# Patient Record
Sex: Female | Born: 1970 | Race: Black or African American | Hispanic: No | Marital: Single | Smoking: Never smoker
Health system: Southern US, Community
[De-identification: ages and names within clinical notes are randomized; demographics above are authoritative.]

## PROBLEM LIST (undated history)

## (undated) DIAGNOSIS — I10 Essential (primary) hypertension: Secondary | ICD-10-CM

## (undated) HISTORY — PX: TUBAL LIGATION: SHX77

---

## 2000-12-01 ENCOUNTER — Emergency Department (HOSPITAL_COMMUNITY): Admission: EM | Admit: 2000-12-01 | Discharge: 2000-12-02 | Payer: Self-pay | Admitting: Emergency Medicine

## 2001-03-02 ENCOUNTER — Other Ambulatory Visit: Admission: RE | Admit: 2001-03-02 | Discharge: 2001-03-02 | Payer: Self-pay | Admitting: *Deleted

## 2001-03-02 ENCOUNTER — Encounter: Admission: RE | Admit: 2001-03-02 | Discharge: 2001-03-02 | Payer: Self-pay | Admitting: Family Medicine

## 2001-07-21 ENCOUNTER — Encounter: Admission: RE | Admit: 2001-07-21 | Discharge: 2001-07-21 | Payer: Self-pay | Admitting: Family Medicine

## 2001-12-17 ENCOUNTER — Encounter: Admission: RE | Admit: 2001-12-17 | Discharge: 2001-12-17 | Payer: Self-pay | Admitting: Family Medicine

## 2002-01-17 ENCOUNTER — Encounter: Admission: RE | Admit: 2002-01-17 | Discharge: 2002-01-17 | Payer: Self-pay | Admitting: Family Medicine

## 2002-02-01 ENCOUNTER — Encounter: Admission: RE | Admit: 2002-02-01 | Discharge: 2002-02-01 | Payer: Self-pay | Admitting: Family Medicine

## 2002-03-18 ENCOUNTER — Other Ambulatory Visit: Admission: RE | Admit: 2002-03-18 | Discharge: 2002-03-18 | Payer: Self-pay | Admitting: *Deleted

## 2002-03-18 ENCOUNTER — Encounter: Admission: RE | Admit: 2002-03-18 | Discharge: 2002-03-18 | Payer: Self-pay | Admitting: Family Medicine

## 2002-04-05 ENCOUNTER — Encounter (INDEPENDENT_AMBULATORY_CARE_PROVIDER_SITE_OTHER): Payer: Self-pay | Admitting: *Deleted

## 2002-04-05 LAB — CONVERTED CEMR LAB

## 2002-06-20 ENCOUNTER — Encounter: Admission: RE | Admit: 2002-06-20 | Discharge: 2002-06-20 | Payer: Self-pay | Admitting: Family Medicine

## 2002-07-19 ENCOUNTER — Encounter: Admission: RE | Admit: 2002-07-19 | Discharge: 2002-07-19 | Payer: Self-pay | Admitting: Sports Medicine

## 2002-08-26 ENCOUNTER — Encounter: Admission: RE | Admit: 2002-08-26 | Discharge: 2002-08-26 | Payer: Self-pay | Admitting: Family Medicine

## 2002-10-13 ENCOUNTER — Encounter: Admission: RE | Admit: 2002-10-13 | Discharge: 2002-10-13 | Payer: Self-pay | Admitting: Family Medicine

## 2002-12-22 ENCOUNTER — Encounter: Admission: RE | Admit: 2002-12-22 | Discharge: 2002-12-22 | Payer: Self-pay | Admitting: Family Medicine

## 2003-01-30 ENCOUNTER — Encounter: Admission: RE | Admit: 2003-01-30 | Discharge: 2003-01-30 | Payer: Self-pay | Admitting: Family Medicine

## 2004-01-12 ENCOUNTER — Emergency Department (HOSPITAL_COMMUNITY): Admission: EM | Admit: 2004-01-12 | Discharge: 2004-01-12 | Payer: Self-pay | Admitting: Family Medicine

## 2006-04-17 ENCOUNTER — Ambulatory Visit: Payer: Self-pay | Admitting: Family Medicine

## 2006-05-01 ENCOUNTER — Ambulatory Visit: Payer: Self-pay | Admitting: Family Medicine

## 2006-07-27 ENCOUNTER — Emergency Department (HOSPITAL_COMMUNITY): Admission: EM | Admit: 2006-07-27 | Discharge: 2006-07-27 | Payer: Self-pay | Admitting: Emergency Medicine

## 2006-11-27 ENCOUNTER — Ambulatory Visit: Payer: Self-pay | Admitting: Family Medicine

## 2006-12-31 DIAGNOSIS — I10 Essential (primary) hypertension: Secondary | ICD-10-CM

## 2006-12-31 DIAGNOSIS — K219 Gastro-esophageal reflux disease without esophagitis: Secondary | ICD-10-CM | POA: Insufficient documentation

## 2006-12-31 DIAGNOSIS — L708 Other acne: Secondary | ICD-10-CM | POA: Insufficient documentation

## 2006-12-31 HISTORY — DX: Essential (primary) hypertension: I10

## 2007-01-01 ENCOUNTER — Encounter (INDEPENDENT_AMBULATORY_CARE_PROVIDER_SITE_OTHER): Payer: Self-pay | Admitting: *Deleted

## 2007-02-11 ENCOUNTER — Emergency Department (HOSPITAL_COMMUNITY): Admission: EM | Admit: 2007-02-11 | Discharge: 2007-02-11 | Payer: Self-pay | Admitting: Emergency Medicine

## 2007-03-17 ENCOUNTER — Telehealth: Payer: Self-pay | Admitting: *Deleted

## 2007-03-18 ENCOUNTER — Encounter: Payer: Self-pay | Admitting: *Deleted

## 2007-03-23 ENCOUNTER — Telehealth: Payer: Self-pay | Admitting: *Deleted

## 2007-07-16 ENCOUNTER — Ambulatory Visit: Payer: Self-pay | Admitting: Family Medicine

## 2007-07-16 ENCOUNTER — Encounter (INDEPENDENT_AMBULATORY_CARE_PROVIDER_SITE_OTHER): Payer: Self-pay | Admitting: Family Medicine

## 2007-07-16 LAB — CONVERTED CEMR LAB
BUN: 12 mg/dL (ref 6–23)
CO2: 23 meq/L (ref 19–32)
Creatinine, Ser: 0.9 mg/dL (ref 0.40–1.20)
Glucose, Bld: 87 mg/dL (ref 70–99)
Whiff Test: POSITIVE

## 2007-07-19 ENCOUNTER — Encounter (INDEPENDENT_AMBULATORY_CARE_PROVIDER_SITE_OTHER): Payer: Self-pay | Admitting: Family Medicine

## 2007-07-22 ENCOUNTER — Encounter (INDEPENDENT_AMBULATORY_CARE_PROVIDER_SITE_OTHER): Payer: Self-pay | Admitting: Family Medicine

## 2007-08-11 ENCOUNTER — Ambulatory Visit: Payer: Self-pay | Admitting: Family Medicine

## 2007-08-12 ENCOUNTER — Encounter (INDEPENDENT_AMBULATORY_CARE_PROVIDER_SITE_OTHER): Payer: Self-pay | Admitting: Family Medicine

## 2007-08-12 ENCOUNTER — Ambulatory Visit: Payer: Self-pay | Admitting: Family Medicine

## 2007-08-12 LAB — CONVERTED CEMR LAB
BUN: 16 mg/dL (ref 6–23)
CO2: 25 meq/L (ref 19–32)
Chloride: 104 meq/L (ref 96–112)
Glucose, Bld: 89 mg/dL (ref 70–99)
Potassium: 3.6 meq/L (ref 3.5–5.3)

## 2007-08-13 ENCOUNTER — Encounter (INDEPENDENT_AMBULATORY_CARE_PROVIDER_SITE_OTHER): Payer: Self-pay | Admitting: Family Medicine

## 2007-08-23 ENCOUNTER — Telehealth (INDEPENDENT_AMBULATORY_CARE_PROVIDER_SITE_OTHER): Payer: Self-pay | Admitting: *Deleted

## 2007-09-03 ENCOUNTER — Ambulatory Visit: Payer: Self-pay | Admitting: Family Medicine

## 2007-09-03 ENCOUNTER — Encounter (INDEPENDENT_AMBULATORY_CARE_PROVIDER_SITE_OTHER): Payer: Self-pay | Admitting: Family Medicine

## 2007-09-13 ENCOUNTER — Telehealth: Payer: Self-pay | Admitting: *Deleted

## 2007-11-24 ENCOUNTER — Ambulatory Visit: Payer: Self-pay | Admitting: Family Medicine

## 2007-11-24 LAB — CONVERTED CEMR LAB

## 2007-12-01 ENCOUNTER — Ambulatory Visit: Payer: Self-pay | Admitting: Family Medicine

## 2008-02-16 ENCOUNTER — Ambulatory Visit: Payer: Self-pay | Admitting: Family Medicine

## 2008-02-16 ENCOUNTER — Encounter (INDEPENDENT_AMBULATORY_CARE_PROVIDER_SITE_OTHER): Payer: Self-pay | Admitting: Family Medicine

## 2008-02-16 DIAGNOSIS — E669 Obesity, unspecified: Secondary | ICD-10-CM | POA: Insufficient documentation

## 2008-02-16 LAB — CONVERTED CEMR LAB
Chlamydia, DNA Probe: NEGATIVE
GC Probe Amp, Genital: NEGATIVE
Whiff Test: POSITIVE

## 2008-02-17 LAB — CONVERTED CEMR LAB
ALT: 18 units/L (ref 0–35)
CO2: 23 meq/L (ref 19–32)
Cholesterol: 146 mg/dL (ref 0–200)
LDL Cholesterol: 55 mg/dL (ref 0–99)
Sodium: 140 meq/L (ref 135–145)
Total Bilirubin: 0.4 mg/dL (ref 0.3–1.2)
Total Protein: 8.1 g/dL (ref 6.0–8.3)
VLDL: 18 mg/dL (ref 0–40)

## 2008-02-24 ENCOUNTER — Telehealth: Payer: Self-pay | Admitting: *Deleted

## 2008-02-25 ENCOUNTER — Ambulatory Visit: Payer: Self-pay | Admitting: Family Medicine

## 2008-02-25 LAB — CONVERTED CEMR LAB
Bilirubin Urine: NEGATIVE
Glucose, Urine, Semiquant: NEGATIVE
Protein, U semiquant: 30
Specific Gravity, Urine: 1.015
pH: 8.5

## 2008-03-08 ENCOUNTER — Encounter (INDEPENDENT_AMBULATORY_CARE_PROVIDER_SITE_OTHER): Payer: Self-pay | Admitting: Family Medicine

## 2008-03-08 ENCOUNTER — Ambulatory Visit: Payer: Self-pay | Admitting: Family Medicine

## 2008-03-08 LAB — CONVERTED CEMR LAB: Whiff Test: POSITIVE

## 2008-03-10 ENCOUNTER — Telehealth (INDEPENDENT_AMBULATORY_CARE_PROVIDER_SITE_OTHER): Payer: Self-pay | Admitting: *Deleted

## 2008-03-13 ENCOUNTER — Telehealth (INDEPENDENT_AMBULATORY_CARE_PROVIDER_SITE_OTHER): Payer: Self-pay | Admitting: Family Medicine

## 2008-03-13 LAB — CONVERTED CEMR LAB: Pap Smear: NORMAL

## 2008-03-20 ENCOUNTER — Telehealth (INDEPENDENT_AMBULATORY_CARE_PROVIDER_SITE_OTHER): Payer: Self-pay | Admitting: *Deleted

## 2008-03-23 ENCOUNTER — Telehealth (INDEPENDENT_AMBULATORY_CARE_PROVIDER_SITE_OTHER): Payer: Self-pay | Admitting: Family Medicine

## 2008-04-10 ENCOUNTER — Encounter (INDEPENDENT_AMBULATORY_CARE_PROVIDER_SITE_OTHER): Payer: Self-pay | Admitting: Family Medicine

## 2008-04-10 ENCOUNTER — Ambulatory Visit: Payer: Self-pay | Admitting: Family Medicine

## 2008-04-10 ENCOUNTER — Telehealth: Payer: Self-pay | Admitting: *Deleted

## 2008-04-12 LAB — CONVERTED CEMR LAB
ALT: 21 units/L (ref 0–35)
AST: 18 units/L (ref 0–37)
CO2: 23 meq/L (ref 19–32)
Calcium: 9.7 mg/dL (ref 8.4–10.5)
Chloride: 103 meq/L (ref 96–112)
Potassium: 3.9 meq/L (ref 3.5–5.3)
Sodium: 139 meq/L (ref 135–145)
Total CK: 278 units/L — ABNORMAL HIGH (ref 7–177)
Total Protein: 8 g/dL (ref 6.0–8.3)

## 2008-04-27 ENCOUNTER — Ambulatory Visit: Payer: Self-pay | Admitting: Family Medicine

## 2008-06-02 ENCOUNTER — Ambulatory Visit: Payer: Self-pay | Admitting: Family Medicine

## 2008-10-31 ENCOUNTER — Telehealth: Payer: Self-pay | Admitting: *Deleted

## 2008-10-31 ENCOUNTER — Ambulatory Visit: Payer: Self-pay | Admitting: Family Medicine

## 2009-03-12 ENCOUNTER — Telehealth: Payer: Self-pay | Admitting: Family Medicine

## 2009-05-02 ENCOUNTER — Telehealth: Payer: Self-pay | Admitting: Family Medicine

## 2009-06-26 ENCOUNTER — Ambulatory Visit: Payer: Self-pay | Admitting: Family Medicine

## 2009-07-13 ENCOUNTER — Encounter: Payer: Self-pay | Admitting: Family Medicine

## 2009-07-13 ENCOUNTER — Encounter: Admission: RE | Admit: 2009-07-13 | Discharge: 2009-07-13 | Payer: Self-pay | Admitting: *Deleted

## 2009-07-13 ENCOUNTER — Ambulatory Visit: Payer: Self-pay | Admitting: Family Medicine

## 2009-07-13 LAB — CONVERTED CEMR LAB
BUN: 15 mg/dL (ref 6–23)
CO2: 24 meq/L (ref 19–32)
Calcium: 9.2 mg/dL (ref 8.4–10.5)
Glucose, Bld: 83 mg/dL (ref 70–99)

## 2009-07-25 ENCOUNTER — Encounter: Payer: Self-pay | Admitting: Family Medicine

## 2009-08-03 ENCOUNTER — Telehealth: Payer: Self-pay | Admitting: Family Medicine

## 2009-11-19 ENCOUNTER — Telehealth (INDEPENDENT_AMBULATORY_CARE_PROVIDER_SITE_OTHER): Payer: Self-pay | Admitting: *Deleted

## 2009-11-19 DIAGNOSIS — R87610 Atypical squamous cells of undetermined significance on cytologic smear of cervix (ASC-US): Secondary | ICD-10-CM

## 2009-12-04 ENCOUNTER — Encounter: Payer: Self-pay | Admitting: Family Medicine

## 2009-12-04 LAB — CONVERTED CEMR LAB: Pap Smear: NORMAL

## 2010-03-19 ENCOUNTER — Ambulatory Visit: Payer: Self-pay | Admitting: Family Medicine

## 2010-03-19 DIAGNOSIS — R609 Edema, unspecified: Secondary | ICD-10-CM | POA: Insufficient documentation

## 2010-03-26 ENCOUNTER — Ambulatory Visit: Payer: Self-pay | Admitting: Family Medicine

## 2010-08-08 ENCOUNTER — Ambulatory Visit (HOSPITAL_COMMUNITY): Admission: RE | Admit: 2010-08-08 | Discharge: 2010-08-08 | Payer: Self-pay | Admitting: Family Medicine

## 2010-08-08 ENCOUNTER — Encounter: Payer: Self-pay | Admitting: Family Medicine

## 2010-08-08 ENCOUNTER — Ambulatory Visit: Payer: Self-pay | Admitting: Family Medicine

## 2010-08-08 DIAGNOSIS — R0789 Other chest pain: Secondary | ICD-10-CM

## 2010-08-08 DIAGNOSIS — N898 Other specified noninflammatory disorders of vagina: Secondary | ICD-10-CM | POA: Insufficient documentation

## 2010-08-08 HISTORY — DX: Other chest pain: R07.89

## 2010-08-09 ENCOUNTER — Encounter: Payer: Self-pay | Admitting: Family Medicine

## 2010-08-19 ENCOUNTER — Ambulatory Visit: Payer: Self-pay | Admitting: Family Medicine

## 2010-08-19 ENCOUNTER — Encounter: Payer: Self-pay | Admitting: Family Medicine

## 2010-08-19 LAB — CONVERTED CEMR LAB
BUN: 19 mg/dL (ref 6–23)
CO2: 26 meq/L (ref 19–32)
Calcium: 9.3 mg/dL (ref 8.4–10.5)
Creatinine, Ser: 0.9 mg/dL (ref 0.40–1.20)
HDL: 56 mg/dL (ref >39)
LDL Cholesterol: 66 mg/dL (ref 0–99)
Triglycerides: 88 mg/dL (ref <150)

## 2010-08-20 ENCOUNTER — Encounter: Payer: Self-pay | Admitting: Family Medicine

## 2010-11-23 ENCOUNTER — Encounter: Payer: Self-pay | Admitting: Family Medicine

## 2010-12-03 NOTE — Miscellaneous (Signed)
Summary: Pap update  Clinical Lists Changes  Observations: Added new observation of PAP DUE: 12/04/2010 (08/09/2010 9:56) Added new observation of [id number]: 06/09/2008 (08/09/2010 9:56) Added new observation of LAST PAP DAT: 12/04/2009 (12/04/2009 9:58) Added new observation of PAP SMEAR: normal (12/04/2009 9:58)     Last PAP:  **ATYPICAL SQUAMOUS CELLS OF UNDETERMINED SIGNIFICANCE (07/13/2009 12:00:00 AM) PAP Result Date:  12/04/2009 PAP Result:  normal PAP Next Due:  1 yr

## 2010-12-03 NOTE — Letter (Signed)
Summary: Generic Letter  [facility]     [place], [place]    Phone:   Fax:     08/20/2010  [facility] [address] [place], [place]  [address]  Dear Ms. Jester,   I am writing to inform you that all of your blood work came back normal.  Your cholesterol panel is excellent.  Your cholesterol is 140, HDL (good cholesterol) is great at 56, and your LDL (bad cholesterol) is perfect at 66.  I hope that you are starting to see results from your efforts to lose weight.  I look forward to seeing you again in clinic in a few months.  Take care!      Sincerely,   Demetria Pore MD  Appended Document: Generic Letter mailed

## 2010-12-03 NOTE — Assessment & Plan Note (Signed)
Summary: med refills/eo   Vital Signs:  Patient profile:   40 year old female Height:      60.75 inches Weight:      187 pounds BMI:     35.75 Temp:     98.7 degrees F oral Pulse rate:   88 / minute BP sitting:   144 / 85  (left arm) Cuff size:   regular  Vitals Entered By: Tessie Fass CMA (Mar 26, 2010 8:34 AM)  Serial Vital Signs/Assessments:  Time      Position  BP       Pulse  Resp  Temp     By                     138/84                         Ardeen Garland  MD  CC: med refill Is Patient Diabetic? No Pain Assessment Patient in pain? no        Primary Care Provider:  Ardeen Garland  MD  CC:  med refill.  History of Present Illness: Pamela Schneider comes in today for refills of her medications and c/o knee pain 1) Meds - HTN meds are lisinopril 40, norvasc 10, and HCTZ 25.  She was seen last week by another provider for bilateral LE edema and elevated BP.  Had stopped lisinopril but was taking her other meds.  Can't give any reason why she wasn't taking it, "just wasn't".  WAs advised to restart.  Has and is feeling fine.  Tolerates them well.  BP at goal, but barely today.  LE edema has improved.  No CP or SOB. GERD med - takes omeprazole.  Works well.  No complaints. 2) knee pain - bilateral.  "STiff in the morning" for 30 min to an hour.  Better by the time she gets to work.  Not even bad enough to take a tylenol or ibuprofen.  No swelling, warmth, redness, catching, locking, or giving out.  No trauma.  Going up stairs makes it worse.  WAlking makes it better.   Habits & Providers  Alcohol-Tobacco-Diet     Tobacco Status: never  Current Medications (verified): 1)  Hydrochlorothiazide 25 Mg  Tabs (Hydrochlorothiazide) .... One Tablet By Mouth Daily 2)  Lisinopril 40 Mg  Tabs (Lisinopril) .... One Tablet By Mouth Once Daily 3)  Omeprazole 20 Mg Cpdr (Omeprazole) .Marland Kitchen.. 1 Tab By Mouth Daily 4)  Amlodipine Besylate 10 Mg  Tabs (Amlodipine Besylate) .... Once  Daily  Allergies: No Known Drug Allergies  Physical Exam  General:  VS Reviewed and rechecked. Well appearing, NAD.  Lungs:  Normal respiratory effort, chest expands symmetrically. Lungs are clear to auscultation, no crackles or wheezes. Heart:  Normal rate and regular rhythm. S1 and S2 normal without gallop, murmur, click, rub or other extra sounds. No JVD 2+ dp pulses Pulses:  2+ radial pulses Extremities:  no edema   Impression & Recommendations:  Problem # 1:  PERIPHERAL EDEMA (ICD-782.3) Assessment Improved  Her updated medication list for this problem includes:    Hydrochlorothiazide 25 Mg Tabs (Hydrochlorothiazide) ..... One tablet by mouth daily  Orders: [facility]- Est  Level 4 (19147)  Problem # 2:  HYPERTENSION, BENIGN SYSTEMIC (ICD-401.1) Assessment: Improved  Advised that she is on max dose of all 3 meds and barely at goal.  Advised to watch sodium, increase activity.  Her updated medication list for this problem  includes:    Hydrochlorothiazide 25 Mg Tabs (Hydrochlorothiazide) ..... One tablet by mouth daily    Lisinopril 40 Mg Tabs (Lisinopril) ..... One tablet by mouth once daily    Amlodipine Besylate 10 Mg Tabs (Amlodipine besylate) ..... Once daily  Orders: [facility]- Est  Level 4 ([address]214)  Problem # 3:  GASTROESOPHAGEAL REFLUX, NO ESOPHAGITIS (ICD-530.81) Assessment: Improved  Her updated medication list for this problem includes:    Omeprazole 20 Mg Cpdr (Omeprazole) .Marland Kitchen... 1 tab by mouth daily  Orders: [facility]- Est  Level 4 ([address]644)  Problem # 4:  KNEE PAIN, BILATERAL (ICD-719.46) Assessment: New  Mild.  No treatment at this time.  Given handout on knee/quad strengthening exercises.  Encouraged to increase physical activity.  Ice or heat as needed.  Return if pain worsens.   Orders: [facility]- Est  Level 4 (03474)  Complete Medication List: 1)  Hydrochlorothiazide 25 Mg Tabs (Hydrochlorothiazide) .... One tablet by mouth daily 2)  Lisinopril 40 Mg Tabs  (Lisinopril) .... One tablet by mouth once daily 3)  Omeprazole 20 Mg Cpdr (Omeprazole) .Marland Kitchen.. 1 tab by mouth daily 4)  Amlodipine Besylate 10 Mg Tabs (Amlodipine besylate) .... Once daily  Patient Instructions: 1)  It was great to see you again today. 2)  Be sure to take all 3 of your blood pressure pills everyday as directed - you need them all! 3)  Walking 20-30 minutes 4-5 times a week will help with weight loss, your BP, and your knee pain. Prescriptions: AMLODIPINE BESYLATE 10 MG  TABS (AMLODIPINE BESYLATE) once daily  #30 x 5   Entered and Authorized by:   Ardeen Garland  MD   Signed by:   Ardeen Garland  MD on 03/26/2010   Method used:   Print then Give to Patient   RxID:   [id number] OMEPRAZOLE 20 MG CPDR (OMEPRAZOLE) 1 tab by mouth daily  #30 x 5   Entered and Authorized by:   Ardeen Garland  MD   Signed by:   Ardeen Garland  MD on 03/26/2010   Method used:   Print then Give to Patient   RxID:   [id number] HYDROCHLOROTHIAZIDE 25 MG  TABS (HYDROCHLOROTHIAZIDE) One tablet by mouth daily  #30 x 5   Entered and Authorized by:   Ardeen Garland  MD   Signed by:   Ardeen Garland  MD on 03/26/2010   Method used:   Print then Give to Patient   RxID:   [id number]

## 2010-12-03 NOTE — Progress Notes (Signed)
Summary: phn msg  Phone Note Call from Patient Call back at [facility] Phone [phone]   Caller: Patient Summary of Call: Pt would like to go to a gyn for her repap and needs [place] to send over her last pap so they can see what the results were.  The doctor she is trying to see is Dr. Stefano Gaul. Initial call taken by: Clydell Hakim,  November 19, 2009 11:19 AM  Follow-up for Phone Call        appointment has been scheduled and patient notified. see under orders Follow-up by: Theresia Lo RN,  November 20, 2009 5:19 PM  New Problems: ASCUS PAP (ICD-795.01)   New Problems: ASCUS PAP (ICD-795.01)

## 2010-12-03 NOTE — Consult Note (Signed)
Summary: [facility]   Imported By: De Nurse 12/07/2009 17:14:43  _____________________________________________________________________  External Attachment:    Type:   Image     Comment:   External Document

## 2010-12-03 NOTE — Assessment & Plan Note (Signed)
Summary: cpe,tcb   Vital Signs:  Patient profile:   40 year old female Height:      60.75 inches Weight:      188 pounds BMI:     35.94 Temp:     98.7 degrees F oral Pulse rate:   91 / minute BP sitting:   128 / 87  (left arm) Cuff size:   large  Vitals Entered By: Jimmy Footman, CMA (August 08, 2010 3:05 PM) CC: cpe, Abdominal Pain, Hypertension Management Is Patient Diabetic? No Pain Assessment Patient in pain? no        Primary Provider:  Demetria Pore MD  CC:  cpe, Abdominal Pain, and Hypertension Management.  History of Present Illness: Pt doing well over all. Agenda:  1. Fishy smelling discharge after periods and vaginal dryness before periods. Pt has had BV and would like the metro-gel. She is sexually active with one partner, who is also monogamous.  Discharge does not happen every month, lasts a few days after period is over. Notices a fishy smell. Used some vaginal gel from GYN the last time it happened, which made the odor go away, but continues to have some discharge for another 1-2 days. No pain. No concern for STD. Does not want to be tested.   2. Chest discomfort. Happened once yesterday and again this morning. 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 No associate sweating, nausea, vomiting,  no pain radiating to jaw or arm. No family history of MI. Has been taking BP meds regularly.  Had [place] food for lunch yesterday. Also having a lot of gas and gas pains yest and today during CP episode. Has h/o reflux, on daily medications. No night time cough, does not wake up with sour taste in mouth. Feels reflux is well controlled.  3. Weight. Pt wants to lose weight. Knows what she needs to eat and not eat and knows that she needs to exercise. Eating chips at least 2x per day. Tries to avoid fried foods and eat dinner before 7. Has been eating out for lunch a lot which causes her to have 2 large meals per day. Does not eat  sweets. Discussed needing to start exercising, says she should walk, maybe find a walking partner, but doesn't want to walk in her neighborhood. Good insight into necessary steps for change.   Dyspepsia History:      There is a prior history of GERD.    Hypertension History:      She complains of chest pain and peripheral edema, but denies headache, palpitations, dyspnea with exertion, visual symptoms, syncope, and side effects from treatment.  She notes no problems with any antihypertensive medication side effects.        Positive major cardiovascular risk factors include hypertension.  Negative major cardiovascular risk factors include female age less than 68 years old, no history of diabetes or hyperlipidemia, negative family history for ischemic heart disease, and non-tobacco-user status.      Current Problems (verified): 1)  Chest Pain, Atypical  (ICD-786.59) 2)  Peripheral Edema  (ICD-782.3) 3)  Ascus Pap  (ICD-795.01) 4)  Screening For Malignant Neoplasm of The Cervix  (ICD-V76.2) 5)  Obesity, Unspecified  (ICD-278.00) 6)  Hypertension, Benign Systemic  (ICD-401.1) 7)  Gastroesophageal Reflux, No Esophagitis  (ICD-530.81) 8)  Acne  (ICD-706.1)  Current Medications (verified): 1)  Hydrochlorothiazide 25 Mg  Tabs (Hydrochlorothiazide) .... One Tablet By Mouth Daily 2)  Lisinopril 40 Mg  Tabs (Lisinopril) .... One Tablet By Mouth Once Daily 3)  Omeprazole 20 Mg Cpdr (Omeprazole) .Marland Kitchen.. 1 Tab By Mouth Daily 4)  Amlodipine Besylate 10 Mg  Tabs (Amlodipine Besylate) .... Once Daily 5)  Metrogel-Vaginal 0.75 % Gel (Metronidazole) .Marland Kitchen.. 1 Full Applicator Into Vagina Once Daily For 5 Days As Needed Vaginal Discharge/odor  Allergies (verified): No Known Drug Allergies  Physical Exam  General:  alert, well-developed, well-nourished, and well-hydrated.   Eyes:  vision grossly intact, pupils equal, pupils round, and pupils reactive to light.   Ears:  R ear normal and L ear normal.  TM nml  bilat.  Mouth:  pharynx pink and moist, no erythema, no exudates, and fair dentition.   Neck:  supple, full ROM, and no masses.   Lungs:  normal respiratory effort, normal breath sounds, no crackles, and no wheezes.   Heart:  normal rate, regular rhythm, no murmur, no gallop, and no rub.   Abdomen:  soft, non-tender, and normal bowel sounds.   Genitalia:  Defered. Will go to GYN for PAP.  Pulses:  2+ pedal pulses bilat.  Extremities:  No LE edema.  Neurologic:  alert & oriented X3 and cranial nerves II-XII intact.   Skin:  color normal and no rashes.   Cervical Nodes:  no anterior cervical adenopathy and no posterior cervical adenopathy.     Impression & Recommendations:  Problem # 1:  CHEST PAIN, ATYPICAL (ICD-786.59) Assessment New Most consistent with GERD esophageal spasm. EKG normal. BP under good control.  No associated with excertion. Will continue to monitor. CP red flags discussed. No further work up needed at this time.  Orders: 12 Lead EKG (12 Lead EKG) [facility]- Est  Level 4 ([address]40)  Problem # 2:  VAGINAL DISCHARGE (ICD-623.5) Assessment: Unchanged  Pt feels that it is similar to her previous BV episodes before. Would prefer the vaginal gel over by mouth medicine. Will give pt empiric treatment to use when she has an episode.  Her updated medication list for this problem includes:    Metrogel-vaginal 0.75 % Gel (Metronidazole) .Marland Kitchen... 1 full applicator into vagina once daily for 5 days as needed vaginal discharge/odor  Orders: [facility]- Est  Level 4 (98119)  Problem # 3:  OBESITY, UNSPECIFIED (ICD-278.00) Assessment: Unchanged Pt states desire to lose weight.  She named many different options and opportunities for weight loss. We determined 2 realistic goals to work on for enxt time (exercise bike 3 times per week, only eating chips once per day). Will check FLP since this has not been done since 2009.  Orders: [facility]- Est  Level 4 ([address]214)Future Orders: Lipid-[facility] ([id number]) ...  08/04/2011  Problem # 4:  Preventive Health Care (ICD-V70.0) Assessment: Comment Only Mammogram information given. Pt due for next pap 2/12 (abnml 9/10, nml 2/11). Pt plans to go to her GYN for this pap but will have results sent to [place].  Problem # 5:  HYPERTENSION, BENIGN SYSTEMIC (ICD-401.1) Under good control, at goal. 128/87 today. Will continue current medications. Will check BMET w/ FLP.   Her updated medication list for this problem includes:    Hydrochlorothiazide 25 Mg Tabs (Hydrochlorothiazide) ..... One tablet by mouth daily    Lisinopril 40 Mg Tabs (Lisinopril) ..... One tablet by mouth once daily    Amlodipine Besylate 10 Mg Tabs (Amlodipine besylate) ..... Once daily  Orders: 12 Lead EKG (12 Lead EKG) [facility]- Est  Level 4 ([address]214)Future Orders: Basic Met-[facility] ([id number]) ... 08/04/2011 Lipid-[facility] ([id number]) ... 08/04/2011  Problem #  6:  GASTROESOPHAGEAL REFLUX, NO ESOPHAGITIS (ICD-530.81)  Aside from recent CP episode, pt feels GERD is under control. No taste in back of mouth, no night time cough, can sleep lying flat. Will continue monitoring.   Her updated medication list for this problem includes:    Omeprazole 20 Mg Cpdr (Omeprazole) .Marland Kitchen... 1 tab by mouth daily  Orders: [facility]- Est  Level 4 ([address]657)  Complete Medication List: 1)  Hydrochlorothiazide 25 Mg Tabs (Hydrochlorothiazide) .... One tablet by mouth daily 2)  Lisinopril 40 Mg Tabs (Lisinopril) .... One tablet by mouth once daily 3)  Omeprazole 20 Mg Cpdr (Omeprazole) .Marland Kitchen.. 1 tab by mouth daily 4)  Amlodipine Besylate 10 Mg Tabs (Amlodipine besylate) .... Once daily 5)  Metrogel-vaginal 0.75 % Gel (Metronidazole) .Marland Kitchen.. 1 full applicator into vagina once daily for 5 days as needed vaginal discharge/odor  Hypertension Assessment/Plan:      The patient's hypertensive risk group is category A: No risk factors and no target organ damage.  Her calculated 10 year risk of coronary heart disease is 1 %.  Today's blood  pressure is 128/87.  Her blood pressure goal is < 140/90.  Patient Instructions: 1)  It was great meeting you today! 2)  Your blood pressure is where we want it! Keep up the great work of taking your medicines every day! 3)  I do not think that your chest pain is related to your heart; I think that it is related to your acid reflux. Keep taking your reflux medicine. You could try carrying Tums or Rolaids with you in case you have indigestion again.  4)  I am giving you a prescription with 2 refills for your vaginal discharge. Use this prescription when you need it. 5)  You expressed today that you want to try to lose weight. I think that this is a great goal! You are already very knowledgeable about things you should and should not eat and the need for exercise. 6)  Goals that you set for yourself and are going to do are: 7)  1. You want to use the exercise bike 3 times a week for 30 minutes. 8)  2. You want to only eat chips/pretzels/special K crisps one time a day instead of 2 or more. 9)  Please come back and see me in 3 months so we can check in with how your weight loss is going. 10)  Please schedule an appointment with the lab sometime next week for FASTING labs-- please try to come first thing in the morning before you eat or drink anything (other than water). 11)  Remember to get your Pap smear done at the GYN! 12)  Please schedule your mammogram with [facility]. Prescriptions: METROGEL-VAGINAL 0.75 % GEL (METRONIDAZOLE) 1 full applicator into vagina once daily for 5 days as needed vaginal discharge/odor  #1 QS x 2   Entered and Authorized by:   Demetria Pore MD   Signed by:   Demetria Pore MD on 08/08/2010   Method used:   Electronically to        [name] (retail)       [address]       [place], [place]  [address]       Ph: [phone]       Fax: [phone]   RxID:   [id number] AMLODIPINE BESYLATE 10 MG  TABS (AMLODIPINE  BESYLATE) once daily  #30 x 5  Entered and Authorized by:   Demetria Pore MD   Signed by:   Demetria Pore MD on 08/08/2010   Method used:   Electronically to        [name] (retail)       [address]       [place], [place]  [address]       Ph: [phone]       Fax: [phone]   RxID:   [id number] OMEPRAZOLE 20 MG CPDR (OMEPRAZOLE) 1 tab by mouth daily  #30 x 5   Entered and Authorized by:   Demetria Pore MD   Signed by:   Demetria Pore MD on 08/08/2010   Method used:   Electronically to        [name] (retail)       [address]       [place], [place]  [address]       Ph: [phone]       Fax: [phone]   RxID:   [id number] LISINOPRIL 40 MG  TABS (LISINOPRIL) One tablet by mouth once daily  #30 x 5   Entered and Authorized by:   Demetria Pore MD   Signed by:   Demetria Pore MD on 08/08/2010   Method used:   Electronically to        [name] (retail)       [address]       [place], [place]  [address]       Ph: [phone]       Fax: [phone]   RxID:   [id number] HYDROCHLOROTHIAZIDE 25 MG  TABS (HYDROCHLOROTHIAZIDE) One tablet by mouth daily  #30 x 5   Entered and Authorized by:   Demetria Pore MD   Signed by:   Demetria Pore MD on 08/08/2010   Method used:   Electronically to        [name] (retail)       [address]       [place], [place]  [address]       Ph: [phone]       Fax: [phone]   RxID:   [id number]    Prevention & Chronic Care Immunizations   Influenza vaccine: Not documented    Tetanus booster: 04/09/2006: Done.   Tetanus booster due: 04/09/2016    Pneumococcal vaccine: Not documented  Other Screening   Pap smear: **ATYPICAL SQUAMOUS CELLS OF UNDETERMINED SIGNIFICANCE  (07/13/2009)   Pap smear due: 12/05/2010    Mammogram: Not  documented   Smoking status: never  (08/08/2010)    Screening comments: Pt had repeat pap 12/2009, NORMAL.  Lipids   Total Cholesterol: 146  (02/16/2008)   LDL: 55  (02/16/2008)   LDL Direct: Not documented   HDL: 73  (02/16/2008)   Triglycerides: 89  (02/16/2008)  Hypertension   Last Blood Pressure: 128 / 87  (08/08/2010)   Serum creatinine: 0.94  (07/13/2009)   BMP action: Ordered   Serum potassium 3.5  (07/13/2009)    Hypertension flowsheet reviewed?: Yes   Progress toward BP goal: At goal  Self-Management Support :   Personal Goals (by the next clinic visit) :      Personal blood pressure goal: 140/90  (07/13/2009)   Hypertension self-management  support: BP self-monitoring log, Written self-care plan, Education handout  (03/19/2010)

## 2010-12-03 NOTE — Assessment & Plan Note (Signed)
Summary: peripheral edema and htn not at goal   Vital Signs:  Patient profile:   40 year old female Weight:      188.19 pounds Temp:     98.6 degrees F oral Pulse rate:   98 / minute BP sitting:   150 / 90  (right arm)  Vitals Entered By: Terese Door (Mar 19, 2010 2:18 PM)  Serial Vital Signs/Assessments:  Time      Position  BP       Pulse  Resp  Temp     By                     150/88                         Marisue Ivan  MD  Comments: right arm, sitting, manual By: Marisue Ivan  MD   CC: swelling in ankles and feet Is Patient Diabetic? No Pain Assessment Patient in pain? no        Primary Care Provider:  Ardeen Garland  MD  CC:  swelling in ankles and feet.  History of Present Illness: 40yo F c/o swelling in feet/ankles  Swelling: Localized to feet and ankles biltateral.  x 1 month.  Course unchanged.  Denies any pain, brusing, or erythema.  Does report being on her feet for long periods of time and increase sodium intake.  Improved with elevation of legs.  Denies any chest pain or sob.  HTN: No adverse effects from medication.  Not checking it regularly.  Reports not taking her lisinopril.  No dizziness, HA, CP, or palpitations.   Habits & Providers  Alcohol-Tobacco-Diet     Tobacco Status: never  Current Medications (verified): 1)  Hydrochlorothiazide 25 Mg  Tabs (Hydrochlorothiazide) .... One Tablet By Mouth Daily 2)  Lisinopril 40 Mg  Tabs (Lisinopril) .... One Tablet By Mouth Once Daily 3)  Omeprazole 20 Mg Cpdr (Omeprazole) .Marland Kitchen.. 1 Tab By Mouth Daily 4)  Amlodipine Besylate 10 Mg  Tabs (Amlodipine Besylate) .... Once Daily 5)  Hydrocodone-Homatropine 5-1.5 Mg/16ml Syrp (Hydrocodone-Homatropine) .... One Teaspoon Every 4-6 Hours For Cough; Disp  ~60 Cc Bottle  Allergies (verified): No Known Drug Allergies  Review of Systems      See HPI  Physical Exam  General:  VS Reviewed and rechecked. Well appearing, NAD.  Neck:  supple, full ROM, no  goiter or mass  Lungs:  Normal respiratory effort, chest expands symmetrically. Lungs are clear to auscultation, no crackles or wheezes. Heart:  Normal rate and regular rhythm. S1 and S2 normal without gallop, murmur, click, rub or other extra sounds. No JVD 2+ dp pulses Extremities:  trace edema above the ankles b/l no edema of upper ext Neurologic:  no focal deficits Skin:  no skin discoloration   Impression & Recommendations:  Problem # 1:  PERIPHERAL EDEMA (ICD-782.3) Assessment New  Edema of LE x 1 month. No signs of DVT.  Low suspicion for heart failure.  Low suspicion for adverse rxn to norvasc. Advised to try compression stockings, decrease sodium intake, and elevate legs as much as tolerated. Will f/u with Dr. Georgiana Shore in 1 month.  Her updated medication list for this problem includes:    Hydrochlorothiazide 25 Mg Tabs (Hydrochlorothiazide) ..... One tablet by mouth daily  Orders: [facility]- Est  Level 4 (16109)  Problem # 2:  HYPERTENSION, BENIGN SYSTEMIC (ICD-401.1) Assessment: Deteriorated  Not at goal. Not taking lisinopril. Advised to restart  lisinopril and f/u with Dr. Georgiana Shore in 1 month to reassess.  Her updated medication list for this problem includes:    Hydrochlorothiazide 25 Mg Tabs (Hydrochlorothiazide) ..... One tablet by mouth daily    Lisinopril 40 Mg Tabs (Lisinopril) ..... One tablet by mouth once daily    Amlodipine Besylate 10 Mg Tabs (Amlodipine besylate) ..... Once daily  Orders: [facility]- Est  Level 4 (16109)  Complete Medication List: 1)  Hydrochlorothiazide 25 Mg Tabs (Hydrochlorothiazide) .... One tablet by mouth daily 2)  Lisinopril 40 Mg Tabs (Lisinopril) .... One tablet by mouth once daily 3)  Omeprazole 20 Mg Cpdr (Omeprazole) .Marland Kitchen.. 1 tab by mouth daily 4)  Amlodipine Besylate 10 Mg Tabs (Amlodipine besylate) .... Once daily 5)  Hydrocodone-homatropine 5-1.5 Mg/40ml Syrp (Hydrocodone-homatropine) .... One teaspoon every 4-6 hours for cough; disp   ~60 cc bottle  Patient Instructions: 1)  Please schedule a follow-up appointment in 1 month with Dr. Georgiana Shore to reassess the swelling. 2)  Restart the Lisinopril. 3)  Pick up compression stockings and wear them as much as tolerated. 4)  Elevate the legs as much as possible. 5)  Cut back on the salt intake.   Prevention & Chronic Care Immunizations   Influenza vaccine: Not documented    Tetanus booster: 04/09/2006: Done.   Tetanus booster due: 04/09/2016    Pneumococcal vaccine: Not documented  Other Screening   Pap smear: **ATYPICAL SQUAMOUS CELLS OF UNDETERMINED SIGNIFICANCE  (07/13/2009)   Pap smear due: 03/13/2009   Smoking status: never  (03/19/2010)  Lipids   Total Cholesterol: 146  (02/16/2008)   LDL: 55  (02/16/2008)   LDL Direct: Not documented   HDL: 73  (02/16/2008)   Triglycerides: 89  (02/16/2008)  Hypertension   Last Blood Pressure: 150 / 90  (03/19/2010)   Serum creatinine: 0.94  (07/13/2009)   BMP action: Ordered   Serum potassium 3.5  (07/13/2009)    Hypertension flowsheet reviewed?: Yes   Progress toward BP goal: Deteriorated  Self-Management Support :   Personal Goals (by the next clinic visit) :      Personal blood pressure goal: 140/90  (07/13/2009)   Hypertension self-management support: BP self-monitoring log, Written self-care plan, Education handout  (03/19/2010)   Hypertension self-care plan printed.   Hypertension education handout printed

## 2010-12-19 ENCOUNTER — Encounter: Payer: Self-pay | Admitting: *Deleted

## 2010-12-31 ENCOUNTER — Other Ambulatory Visit: Payer: Self-pay | Admitting: Obstetrics and Gynecology

## 2010-12-31 DIAGNOSIS — Z1231 Encounter for screening mammogram for malignant neoplasm of breast: Secondary | ICD-10-CM

## 2011-01-07 ENCOUNTER — Ambulatory Visit: Payer: Self-pay

## 2011-08-04 ENCOUNTER — Other Ambulatory Visit: Payer: Self-pay | Admitting: Family Medicine

## 2011-08-04 NOTE — Telephone Encounter (Signed)
Refill request

## 2011-08-20 ENCOUNTER — Encounter: Payer: Self-pay | Admitting: Family Medicine

## 2011-08-20 ENCOUNTER — Ambulatory Visit (INDEPENDENT_AMBULATORY_CARE_PROVIDER_SITE_OTHER): Payer: PRIVATE HEALTH INSURANCE | Admitting: Family Medicine

## 2011-08-20 DIAGNOSIS — Z01419 Encounter for gynecological examination (general) (routine) without abnormal findings: Secondary | ICD-10-CM | POA: Insufficient documentation

## 2011-08-20 DIAGNOSIS — I10 Essential (primary) hypertension: Secondary | ICD-10-CM

## 2011-08-20 DIAGNOSIS — N898 Other specified noninflammatory disorders of vagina: Secondary | ICD-10-CM

## 2011-08-20 DIAGNOSIS — Z0000 Encounter for general adult medical examination without abnormal findings: Secondary | ICD-10-CM

## 2011-08-20 DIAGNOSIS — N760 Acute vaginitis: Secondary | ICD-10-CM

## 2011-08-20 LAB — BASIC METABOLIC PANEL
CO2: 23 mEq/L (ref 19–32)
Calcium: 9.4 mg/dL (ref 8.4–10.5)
Chloride: 103 mEq/L (ref 96–112)
Potassium: 3.6 mEq/L (ref 3.5–5.3)
Sodium: 140 mEq/L (ref 135–145)

## 2011-08-20 LAB — POCT WET PREP (WET MOUNT): WBC, Wet Prep HPF POC: <5

## 2011-08-20 MED ORDER — AMLODIPINE BESYLATE 10 MG PO TABS: 10.0000 mg | ORAL_TABLET | Freq: Every day | ORAL | Status: DC

## 2011-08-20 MED ORDER — HYDROCHLOROTHIAZIDE 25 MG PO TABS: 25.0000 mg | ORAL_TABLET | Freq: Every day | ORAL | Status: DC

## 2011-08-20 MED ORDER — LISINOPRIL 40 MG PO TABS: 40.0000 mg | ORAL_TABLET | Freq: Every day | ORAL | Status: DC

## 2011-08-20 MED ORDER — OMEPRAZOLE 20 MG PO CPDR: 20.0000 mg | DELAYED_RELEASE_CAPSULE | Freq: Every day | ORAL | Status: DC

## 2011-08-20 NOTE — Patient Instructions (Addendum)
It was nice to see you! Make sure you are taking all 3 of your blood pressure medicines! You need to get a mammogram!! Your vaginal discharge looks like it is normal and does not need to be treated.  You can use the boric acid given to you by your GYN as needed.  Come back and see me in 4-6 weeks to see how your blood pressure is doing.

## 2011-08-20 NOTE — Progress Notes (Signed)
S: Pt comes in today for follow up.  VAGINAL DISCHARGE GYN gave her Rx for boric acid but she did not have it filled.  Noticed yellow/tan d/c after her period was over 1-2 weeks ago. + foul odor. Used metrogel supp but does not think that it helped. Now also feels like she has some vaginal dryness despite the d/c.  No itching or burning. No dysuria, just d/c. No fever/chills/N/V/D.   HYPERTENSION BP: 174/96, 160/95 Meds: norvasc 10, HCTZ 25, lisinopril 40 Taking meds: Yes, but sometimes skips norvasc b/c she thinks it causes cramping    # of doses missed/week: 2 Symptoms: Headache: No Dizziness: No Vision changes: No SOB:  No Chest pain: No LE swelling: Yes occasionally, worse at night time Tobacco use: No Diet: ate pork chops yesterday, not really dieting  Takes multivitamin and ?potassium supplement at home Did not take her norvasc today.      ROS: Per HPI  History  Smoking status  . Never Smoker   Smokeless tobacco  . Not on file    O:  Filed Vitals:   08/20/11 1053  BP: 174/96  Pulse: 81  Temp: 98.6 F (37 C)    Gen: NAD CV: RRR, no murmur Pulm: CTA bilat, no wheezes or crackles Abd: soft, NT Ext: Warm, no chronic skin changes, no edema Pelvic: minimal white d/c, no odor, cervical os non-friable, no bleeding after GC/Chlam   A/P: 40 y.o. female p/w HTN, vaginal d/c -See problem list -f/u in 1 month

## 2011-08-20 NOTE — Assessment & Plan Note (Signed)
Pt BP significantly elevated but has not been taking norvasc b/c of cramping.  Will check BMET. Pt would rather restart norvasc rather than starting new medicine. Pt also w/ some weight gain, which may be contributing.

## 2011-08-20 NOTE — Assessment & Plan Note (Signed)
Given mammogram info. Checking BMET for cramping. Doing well overall, discussed need for diet/weight loss. Pt in pre-contemplative stage for this. Paps are done at GYN (Dr. Stefano Gaul), most recently 12/2010 and was nml per report. Pt declines flu, states she will receive this at work.

## 2011-08-20 NOTE — Assessment & Plan Note (Signed)
No obvious infection, GC/Chlam pending. Already did metrogel so may have been BV. Told pt to return if she has increased vaginal d/c, pelvic pain, fever/chills. No abx given today.

## 2011-08-21 ENCOUNTER — Encounter: Payer: Self-pay | Admitting: Family Medicine

## 2011-09-11 ENCOUNTER — Telehealth: Payer: Self-pay | Admitting: Family Medicine

## 2011-09-11 NOTE — Telephone Encounter (Signed)
Patient returned call, has not picked up gel.

## 2011-09-11 NOTE — Telephone Encounter (Signed)
Pamela Schneider was in on 10/17 for her Physical.  At that time, she had some discharge that she was given some gel for.  She is hoping to get a medication that she can take by mouth.

## 2011-09-11 NOTE — Telephone Encounter (Signed)
She did not have BV or vaginal infection when she was seen.  Instructed to return if she continued having d/c so that we could re-do a wet prep (pt had just used OTC vaginal gel prior to appt so that may have partially treated whatever she had).  She needs an appt for a wet prep before anything can be called in since there was not an infection to treat at her last visit.

## 2011-09-11 NOTE — Telephone Encounter (Signed)
Please ask pt, if she picked up the metronidizole gel already? Waiting for pt to call back. Left message. Pamela Schneider, Renato Battles

## 2011-09-11 NOTE — Telephone Encounter (Signed)
Will fwd. To Dr.McGill to send Rx for flagyl (?) .Arlyss Repress

## 2011-10-29 ENCOUNTER — Encounter: Payer: Self-pay | Admitting: Family Medicine

## 2011-10-29 ENCOUNTER — Ambulatory Visit (INDEPENDENT_AMBULATORY_CARE_PROVIDER_SITE_OTHER): Payer: PRIVATE HEALTH INSURANCE | Admitting: Family Medicine

## 2011-10-29 DIAGNOSIS — I10 Essential (primary) hypertension: Secondary | ICD-10-CM

## 2011-10-29 DIAGNOSIS — N898 Other specified noninflammatory disorders of vagina: Secondary | ICD-10-CM

## 2011-10-29 DIAGNOSIS — E669 Obesity, unspecified: Secondary | ICD-10-CM

## 2011-10-29 LAB — POCT WET PREP (WET MOUNT)
Trichomonas Wet Prep HPF POC: NEGATIVE
Yeast Wet Prep HPF POC: NEGATIVE

## 2011-10-29 MED ORDER — METRONIDAZOLE 500 MG PO TABS: 500.0000 mg | ORAL_TABLET | Freq: Three times a day (TID) | ORAL | Status: AC

## 2011-10-29 NOTE — Assessment & Plan Note (Addendum)
BP elevated but pt not taking lisinopril now because of muscle cramping.  Lytes WNL on last BMET 08/2011.  Encouraged pt to restart this medicine, pt in agreement. Also emphasized the importance of a low salt diet in an african [place] female with uncontrolled HTN.   BP Readings from Last 3 Encounters:  10/29/11 152/97  08/20/11 174/96  08/08/10 128/87

## 2011-10-29 NOTE — Patient Instructions (Addendum)
It was great to see you! For your swelling, start following a low salt diet.  This should help with both the swelling and your blood pressure. Please restart your lisinopril!! Today, the wet prep for your discharge shows bacterial vaginosis.  I am sending in a 7 day by mouth medicine for you to take to help clear it up. Please come back and see me in 1 month for a blood pressure check.      2 Gram Low Sodium Diet A 2 gram sodium diet restricts the amount of sodium in the diet to no more than 2 g or 2000 mg daily. Limiting the amount of sodium is often used to help lower blood pressure. It is important if you have heart, liver, or kidney problems. Many foods contain sodium for flavor and sometimes as a preservative. When the amount of sodium in a diet needs to be low, it is important to know what to look for when choosing foods and drinks. The following includes some information and guidelines to help make it easier for you to adapt to a low sodium diet. QUICK TIPS  Do not add salt to food.   Avoid convenience items and fast food.   Choose unsalted snack foods.   Buy lower sodium products, often labeled as "lower sodium" or "no salt added."   Check food labels to learn how much sodium is in 1 serving.   When eating at a restaurant, ask that your food be prepared with less salt or none, if possible.  READING FOOD LABELS FOR SODIUM INFORMATION The nutrition facts label is a good place to find how much sodium is in foods. Look for products with no more than 500 to 600 mg of sodium per meal and no more than 150 mg per serving. Remember that 2 g = 2000 mg. The food label may also list foods as:  Sodium-free: Less than 5 mg in a serving.   Very low sodium: 35 mg or less in a serving.   Low-sodium: 140 mg or less in a serving.   Light in sodium: 50% less sodium in a serving. For example, if a food that usually has 300 mg of sodium is changed to become light in sodium, it will have 150 mg  of sodium.   Reduced sodium: 25% less sodium in a serving. For example, if a food that usually has 400 mg of sodium is changed to reduced sodium, it will have 300 mg of sodium.  CHOOSING FOODS Grains  Avoid: Salted crackers and snack items. Some cereals, including instant hot cereals. Bread stuffing and biscuit mixes. Seasoned rice or pasta mixes.   Choose: Unsalted snack items. Low-sodium cereals, oats, puffed wheat and rice, shredded wheat. English muffins and bread. Pasta.  Meats  Avoid: Salted, canned, smoked, spiced, pickled meats, including fish and poultry. Bacon, ham, sausage, cold cuts, hot dogs, anchovies.   Choose: Low-sodium canned tuna and salmon. Fresh or frozen meat, poultry, and fish.  Dairy  Avoid: Processed cheese and spreads. Cottage cheese. Buttermilk and condensed milk. Regular cheese.   Choose: Milk. Low-sodium cottage cheese. Yogurt. Sour cream. Low-sodium cheese.  Fruits and Vegetables  Avoid: Regular canned vegetables. Regular canned tomato sauce and paste. Frozen vegetables in sauces. Olives. Rosita Fire. Relishes. Sauerkraut.   Choose: Low-sodium canned vegetables. Low-sodium tomato sauce and paste. Frozen or fresh vegetables. Fresh and frozen fruit.  Condiments  Avoid: Canned and packaged gravies. Worcestershire sauce. Tartar sauce. Barbecue sauce. Soy sauce. Steak sauce. Ketchup. Onion,  garlic, and table salt. Meat flavorings and tenderizers.   Choose: Fresh and dried herbs and spices. Low-sodium varieties of mustard and ketchup. Lemon juice. Tabasco sauce. Horseradish.  SAMPLE 2 GRAM SODIUM MEAL PLAN Breakfast / Sodium (mg)  1 cup low-fat milk / 143 mg   2 slices whole-wheat toast / 270 mg   1 tbs heart-healthy margarine / 153 mg   1 hard-boiled egg / 139 mg   1 small orange / 0 mg  Lunch / Sodium (mg)  1 cup raw carrots / 76 mg    cup hummus / 298 mg   1 cup low-fat milk / 143 mg    cup red grapes / 2 mg   1 whole-wheat pita bread / 356  mg  Dinner / Sodium (mg)  1 cup whole-wheat pasta / 2 mg   1 cup low-sodium tomato sauce / 73 mg   3 oz lean ground beef / 57 mg   1 small side salad (1 cup raw spinach leaves,  cup cucumber,  cup yellow bell pepper) with 1 tsp olive oil and 1 tsp red wine vinegar / 25 mg  Snack / Sodium (mg)  1 container low-fat vanilla yogurt / 107 mg   3 graham cracker squares / 127 mg  Nutrient Analysis  Calories: 2033   Protein: 77 g   Carbohydrate: 282 g   Fat: 72 g   Sodium: 1971 mg  Document Released: 10/20/2005 Document Revised: 07/02/2011 Document Reviewed: 01/21/2010 [facility] Patient Information 2012 [place], [place].

## 2011-10-29 NOTE — Progress Notes (Signed)
S: Pt comes in today for follow up.  HYPERTENSION BP: 152/97 Meds: norvasc, HCTZ, lisinopril-- NOT TAKING LISINOPRIL Taking meds: Yes : except for lisinopril    # of doses missed/week: 1 Symptoms: Headache: No Dizziness: No Vision changes: No SOB:  No Chest pain: No LE swelling: Yes : worse at the end of the day Tobacco use: No Diet: doesn't add salt to her food but does not watch sodium intake   VAGINAL DISCHARGE Continues to have discharge off and on. At last visit, her discharge was not consistent with bacterial vaginosis, however she had just recently used MetroGel vaginal suppository. Since that time she has only used boric acid suppositories. She most recently use this 3-4 nights ago. She is wearing a pantiliner everyday but does not have daily discharge. Discharge is usually only after having sex. She is able to see the discharge on her partner and is able to smell a fishy odor. She douches one time per month, after her period. She does not have any pain or discomfort with sex. She is not currently concerned about having an STD. She states that the discharge, when it happens, it is milky and has a foul smell to it.  ROS: Per HPI  History  Smoking status  . Never Smoker   Smokeless tobacco  . Not on file    O:  Filed Vitals:   10/29/11 1331  BP: 152/97  Pulse: 88  Temp: 98.6 F (37 C)    Gen: NAD CV: RRR, no murmur Pulm: CTA bilat, no wheezes or crackles Abd: soft, NT Pelvic: vagina and vulva normal, small amount of milky white discharge present, cervix non friable or irritated appearing, no CMT, no adnexal fullness or tenderness  Ext: Warm, no chronic skin changes, no edema   A/P: 40 y.o. female p/w HTN, BV -See problem list -f/u in 1 month for BP recheck

## 2011-10-29 NOTE — Assessment & Plan Note (Signed)
Few clue cells on wet prep, will treat with flagyl for BV.

## 2011-10-29 NOTE — Assessment & Plan Note (Signed)
Pt aware she needs to lose weight.  Not yet ready to make goals.  Will think about this for next visit.

## 2011-11-05 ENCOUNTER — Telehealth: Payer: Self-pay | Admitting: Family Medicine

## 2011-11-05 NOTE — Telephone Encounter (Signed)
Pt called back. Took flagyl tabs x 5 days. Felt sick to her stomach and d/c. Denies symptoms of vaginal d/c, itching. Told pt, that it is probably fine not to restart any other meds. 5 days of tx may be enough. Pt agreed and will call, if symptoms return. Fwd. To Dr.McGill for review. Lorenda Hatchet, Renato Battles

## 2011-11-05 NOTE — Telephone Encounter (Signed)
Pamela Schneider had been taking the antibiotic prescribed, but it has made her very sick so she has stopped and would like to know if there is something else she can take instead.

## 2011-11-05 NOTE — Telephone Encounter (Signed)
Waiting for call back. Pamela Schneider, Pamela Schneider (Per chart pt received metronidazole tablets.)

## 2011-11-06 NOTE — Telephone Encounter (Signed)
If she's not having any discharge or symptoms, 5 days is enough.  If she starts having discharge again, we can use the vaginal suppository. Thanks!

## 2012-01-19 ENCOUNTER — Ambulatory Visit: Payer: Self-pay | Admitting: Obstetrics and Gynecology

## 2012-01-29 ENCOUNTER — Ambulatory Visit (INDEPENDENT_AMBULATORY_CARE_PROVIDER_SITE_OTHER): Payer: PRIVATE HEALTH INSURANCE | Admitting: Obstetrics and Gynecology

## 2012-01-29 DIAGNOSIS — Z01419 Encounter for gynecological examination (general) (routine) without abnormal findings: Secondary | ICD-10-CM

## 2012-01-29 DIAGNOSIS — N898 Other specified noninflammatory disorders of vagina: Secondary | ICD-10-CM

## 2012-03-25 ENCOUNTER — Telehealth: Payer: Self-pay | Admitting: Obstetrics and Gynecology

## 2012-03-25 NOTE — Telephone Encounter (Signed)
TC TO PT REGARDING MSG, LM ON VM TO CALL BACK. 

## 2012-03-25 NOTE — Telephone Encounter (Signed)
TRIAGE/ELECT.

## 2012-04-05 ENCOUNTER — Telehealth: Payer: Self-pay | Admitting: Obstetrics and Gynecology

## 2012-04-05 NOTE — Telephone Encounter (Signed)
Triage/epic 

## 2012-04-14 ENCOUNTER — Other Ambulatory Visit: Payer: Self-pay | Admitting: Family Medicine

## 2012-04-14 DIAGNOSIS — Z1231 Encounter for screening mammogram for malignant neoplasm of breast: Secondary | ICD-10-CM

## 2012-04-15 ENCOUNTER — Telehealth: Payer: Self-pay | Admitting: Obstetrics and Gynecology

## 2012-04-15 NOTE — Telephone Encounter (Signed)
Informed that Pap was not done at her last appointment since she does not have history of abnormal Pap. Also informed pt that Pap will be done in 2014 or 2015 pending Dr. Stefano Gaul or other provider recommendation. Pt voiced understanding.

## 2012-04-15 NOTE — Telephone Encounter (Signed)
Triage/epic 

## 2012-04-20 ENCOUNTER — Other Ambulatory Visit: Payer: Self-pay | Admitting: *Deleted

## 2012-04-20 DIAGNOSIS — Z01419 Encounter for gynecological examination (general) (routine) without abnormal findings: Secondary | ICD-10-CM

## 2012-04-20 MED ORDER — OMEPRAZOLE 20 MG PO CPDR: 20.0000 mg | DELAYED_RELEASE_CAPSULE | Freq: Every day | ORAL | Status: DC

## 2012-05-26 ENCOUNTER — Ambulatory Visit (HOSPITAL_COMMUNITY)
Admission: RE | Admit: 2012-05-26 | Discharge: 2012-05-26 | Disposition: A | Discharge status: Home / Self Care | Payer: PRIVATE HEALTH INSURANCE | Source: Ambulatory Visit | Attending: Family Medicine | Admitting: Family Medicine

## 2012-05-26 DIAGNOSIS — Z1231 Encounter for screening mammogram for malignant neoplasm of breast: Secondary | ICD-10-CM

## 2012-06-14 ENCOUNTER — Other Ambulatory Visit: Payer: Self-pay | Admitting: *Deleted

## 2012-06-14 DIAGNOSIS — I10 Essential (primary) hypertension: Secondary | ICD-10-CM

## 2012-06-14 MED ORDER — HYDROCHLOROTHIAZIDE 25 MG PO TABS: 25.0000 mg | ORAL_TABLET | Freq: Every day | ORAL | Status: DC

## 2012-06-14 NOTE — Telephone Encounter (Signed)
Pt needs appt with PCP for any further refills- has not been seen since 10/2011.

## 2012-08-27 ENCOUNTER — Other Ambulatory Visit: Payer: Self-pay | Admitting: *Deleted

## 2012-08-27 DIAGNOSIS — I10 Essential (primary) hypertension: Secondary | ICD-10-CM

## 2012-08-29 NOTE — Telephone Encounter (Signed)
Pt was told 06/2012 she needed appt with PCP for any refills.  No appt is scheduled.  Pt has not been seen since 10/2011.  I will fill enough for patient to make it to an appt if an appt if scheduled. Thanks!

## 2012-08-30 MED ORDER — HYDROCHLOROTHIAZIDE 25 MG PO TABS: 25.0000 mg | ORAL_TABLET | Freq: Every day | ORAL | Status: DC

## 2012-08-30 NOTE — Telephone Encounter (Signed)
Called patient and told her she needs an appointment with PCP for any refills. Appointment scheduled on 09/10/12 with McGill. Will send enough meds to get to that appointment as patient is completely out.Busick, Rodena Medin

## 2012-09-10 ENCOUNTER — Ambulatory Visit: Payer: PRIVATE HEALTH INSURANCE | Admitting: Family Medicine

## 2012-10-08 ENCOUNTER — Other Ambulatory Visit: Payer: Self-pay | Admitting: *Deleted

## 2012-10-08 DIAGNOSIS — I10 Essential (primary) hypertension: Secondary | ICD-10-CM

## 2012-10-08 NOTE — Telephone Encounter (Signed)
Denied. Pt was told 06/2012 and 08/2012 that she needs appt. No showed for appt in 09/2012. Pt with appt schedule with me 10/12/12. Will give refill at that appt if patient keeps appt.

## 2012-10-12 ENCOUNTER — Ambulatory Visit (INDEPENDENT_AMBULATORY_CARE_PROVIDER_SITE_OTHER): Payer: PRIVATE HEALTH INSURANCE | Admitting: Family Medicine

## 2012-10-12 ENCOUNTER — Encounter: Payer: Self-pay | Admitting: Family Medicine

## 2012-10-12 VITALS — BP 174/90 | HR 76 | Ht 61.0 in | Wt 186.5 lb

## 2012-10-12 DIAGNOSIS — I10 Essential (primary) hypertension: Secondary | ICD-10-CM

## 2012-10-12 LAB — BASIC METABOLIC PANEL
CO2: 26 mEq/L (ref 19–32)
Chloride: 98 mEq/L (ref 96–112)
Potassium: 3.6 mEq/L (ref 3.5–5.3)

## 2012-10-12 MED ORDER — HYDROCHLOROTHIAZIDE 25 MG PO TABS: 25.0000 mg | ORAL_TABLET | Freq: Every day | ORAL | Status: DC

## 2012-10-12 NOTE — Patient Instructions (Signed)
It was good to see you today.  We are checking some blood work, I will send you a letter with the results.   I'm refilling your HCTZ for 2 months.  Please come back within that time frame (preferably within 1 month) so we can recheck your blood pressure.  If it is high then, we will need to readd the lisinopril.

## 2012-10-12 NOTE — Assessment & Plan Note (Signed)
Elevated today but out of meds.  Will restart HCTZ and check BMET.  Pt knows she needs to f/u in 1-2 months for recheck.  If still elevated, she is aware she will need to have a 2nd agent, such as lisinopril, started.

## 2012-10-12 NOTE — Progress Notes (Signed)
S: Pt comes in today for follow up.   HYPERTENSION BP: 174/90 Meds: HCTZ 25, norvasc 10, lisinopril 40 (ONLY taking HCTZ, but ran out last week) Taking meds: No    # of doses missed/week: all doses of everything Symptoms: Headache: No Dizziness: No Vision changes: No SOB:  No Chest pain: No LE swelling: No Tobacco use: No -Norvasc made her cramp so she stopped it; just stopped taking lisinopril for no real reason -Doesn't get swelling in her legs anymore and is trying to eat less salt    ROS: Per HPI  History  Smoking status  . Never Smoker   Smokeless tobacco  . Not on file    O:  Filed Vitals:   10/12/12 1618  BP: 174/90  Pulse: 76    Gen: NAD CV: RRR, no murmur Pulm: CTA bilat, no wheezes or crackles Ext: Warm, no chronic skin changes, no edema   A/P: 41 y.o. female p/w HTN, out of meds -See problem list -f/u in 1 month

## 2012-10-13 ENCOUNTER — Encounter: Payer: Self-pay | Admitting: Family Medicine

## 2012-12-09 ENCOUNTER — Other Ambulatory Visit: Payer: Self-pay | Admitting: Family Medicine

## 2012-12-31 ENCOUNTER — Telehealth: Payer: Self-pay | Admitting: Family Medicine

## 2012-12-31 DIAGNOSIS — I10 Essential (primary) hypertension: Secondary | ICD-10-CM

## 2012-12-31 NOTE — Telephone Encounter (Signed)
Patient is calling because her pharmacy has sent several requests for her HCTZ and have not heard back anything yet.  I do not see any evidence of this.  She really would like some before the weekend because she hasn't had any since Wednesday.

## 2012-12-31 NOTE — Telephone Encounter (Signed)
Pt has upcoming appt 01/18/13 with you.  Lorenda Hatchet, Renato Battles

## 2013-01-02 MED ORDER — HYDROCHLOROTHIAZIDE 25 MG PO TABS: 25.0000 mg | ORAL_TABLET | Freq: Every day | ORAL | Status: DC

## 2013-01-02 NOTE — Telephone Encounter (Signed)
Refilled.  Pharmacy needs to send request next time-- there has been no attempt to refill this prior to today. Thanks!

## 2013-01-18 ENCOUNTER — Ambulatory Visit: Payer: PRIVATE HEALTH INSURANCE | Admitting: Family Medicine

## 2013-01-24 ENCOUNTER — Encounter: Payer: Self-pay | Admitting: Family Medicine

## 2013-01-24 ENCOUNTER — Ambulatory Visit (INDEPENDENT_AMBULATORY_CARE_PROVIDER_SITE_OTHER): Payer: PRIVATE HEALTH INSURANCE | Admitting: Family Medicine

## 2013-01-24 VITALS — BP 193/98 | HR 87 | Temp 98.1°F | Ht 61.0 in | Wt 183.4 lb

## 2013-01-24 DIAGNOSIS — I10 Essential (primary) hypertension: Secondary | ICD-10-CM

## 2013-01-24 MED ORDER — LISINOPRIL 40 MG PO TABS: 40.0000 mg | ORAL_TABLET | Freq: Every day | ORAL | Status: DC

## 2013-01-24 MED ORDER — HYDROCHLOROTHIAZIDE 25 MG PO TABS: 25.0000 mg | ORAL_TABLET | Freq: Every day | ORAL | Status: DC

## 2013-01-24 NOTE — Patient Instructions (Addendum)
It was good to see you today.  Your blood pressure was really high!  Keep taking the HCTZ.  I am also starting your lisinopril back.  This is at the drug store waiting for you.  If you start feeling dizzy when standing or feel like your blood pressure is too low, decrease your lisinopril to 1/2 tablet (but I think you will tolerate the whole tablet just fine).  We will recheck your kidneys your next visit.  Come back in a month and we will see how your blood pressure is doing.

## 2013-01-24 NOTE — Assessment & Plan Note (Signed)
Elevated today on HCTZ. Restart lisinopril.  Recheck BMET next visit; last Cr 1 10/2012.  Consider changing HCTZ to chlorthalidone if compliant and still elevated next month. Discussed low salt diet, will discuss again next visit.  Also emphasized weight loss.

## 2013-01-24 NOTE — Progress Notes (Signed)
S: Pt comes in today for follow up.  HYPERTENSION BP: 193/98 Meds: HCTZ 25, lisino 40, norvasc 10 (causes cramping so does not take this) Taking meds: Yes : taking HCTZ only    # of doses missed/week: 3- almost never takes it on the weekend Symptoms: Headache: No Dizziness: No Vision changes: No SOB:  No Chest pain: No LE swelling: No Tobacco use: No Diet: doesn't add salt into food, but does use it to season   ROS: Per HPI  History  Smoking status  . Never Smoker   Smokeless tobacco  . Not on file    O:  Filed Vitals:   01/24/13 1429  BP: 193/98  Pulse: 87  Temp: 98.1 F (36.7 C)    Gen: NAD CV: RRR, no murmur Pulm: CTA bilat, no wheezes or crackles Ext: Warm,  no edema   A/P: 42 y.o. female p/w uncontrolled HTN -See problem list -f/u in 1 month

## 2013-02-24 ENCOUNTER — Ambulatory Visit (INDEPENDENT_AMBULATORY_CARE_PROVIDER_SITE_OTHER): Payer: PRIVATE HEALTH INSURANCE | Admitting: Family Medicine

## 2013-02-24 ENCOUNTER — Encounter: Payer: Self-pay | Admitting: Family Medicine

## 2013-02-24 VITALS — BP 167/96 | HR 80 | Ht 61.0 in | Wt 184.4 lb

## 2013-02-24 DIAGNOSIS — I10 Essential (primary) hypertension: Secondary | ICD-10-CM

## 2013-02-24 DIAGNOSIS — J309 Allergic rhinitis, unspecified: Secondary | ICD-10-CM | POA: Insufficient documentation

## 2013-02-24 MED ORDER — CHLORTHALIDONE 100 MG PO TABS: 100.0000 mg | ORAL_TABLET | Freq: Every day | ORAL | Status: DC

## 2013-02-24 MED ORDER — FLUTICASONE PROPIONATE 50 MCG/ACT NA SUSP: 2.0000 | Freq: Every day | NASAL | Status: DC

## 2013-02-24 NOTE — Progress Notes (Signed)
S: Pt comes in today for follow up.  HYPERTENSION BP: 167/96 Meds: HCTZ 25, lisino 40 (restarted 01/24/13); does not tolerate norvasc- causes cramping Taking meds: Yes     # of doses missed/week: 0 Symptoms: Headache: No Dizziness: No Vision changes: No SOB:  No Chest pain: No LE swelling: No Tobacco use: No   ALLERGIES Usually don't bother her too much.  Has been taking OTC Claritin.  +congestion, +runny nose, + cough, + itchy eyes.     ROS: Per HPI  History  Smoking status  . Never Smoker   Smokeless tobacco  . Not on file    O:  Filed Vitals:   02/24/13 1341  BP: 167/96  Pulse: 80    Gen: NAD CV: RRR, no murmur Pulm: CTA bilat, no wheezes or crackles Ext: Warm, no edema   A/P: 42 y.o. female p/w HTN -See problem list -f/u in 4 weeks

## 2013-02-24 NOTE — Assessment & Plan Note (Addendum)
Slightly better on HCTZ 25 + lisino 40 but still elevated.  Pt would prefer to change from HCTZ to chlorthalidone prior to adding 3rd agent (likely BB since she did not tolerate norvasc 2/2 muscle cramps).  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  If those are not elevated, could do 24hr amb BP monitoring prior to adding 3rd agent if still elevated here at next visit in 1 month but normal at home.   BP Readings from Last 3 Encounters:  02/24/13 167/96  01/24/13 193/98  10/12/12 174/90    Lab Results  Component Value Date   NA 142 10/12/2012   K 3.6 10/12/2012   CREATININE 1.02 10/12/2012    Assessment: Blood pressure control: moderately elevated Progress toward BP goal:  improved Comments:   Plan: Medications:  continue current medications Educational resources provided:   Self management tools provided: instructions for home blood pressure monitoring Other plans:

## 2013-02-24 NOTE — Assessment & Plan Note (Signed)
Continue claritin, added flonase. Advised not to use decongestants with her HTN.

## 2013-02-24 NOTE — Patient Instructions (Signed)
It was good to see you today.  Your blood pressure is better, but still high.  STOP the HCTZ-- we are going to switch it to a different medicine called chlorthalidone. CONTINUE the lisinopril.  Check your blood pressure a few times per week when you are at work (and CAL[employer]) and write the numbers down-- bring these back next time and we will see if it's high when you are not here as well.  I will see you back in about 4 weeks.

## 2013-03-24 ENCOUNTER — Ambulatory Visit: Payer: PRIVATE HEALTH INSURANCE | Admitting: Family Medicine

## 2013-07-19 ENCOUNTER — Other Ambulatory Visit: Payer: Self-pay | Admitting: Family Medicine

## 2013-07-19 DIAGNOSIS — Z1231 Encounter for screening mammogram for malignant neoplasm of breast: Secondary | ICD-10-CM

## 2013-07-21 ENCOUNTER — Ambulatory Visit (HOSPITAL_COMMUNITY)
Admission: RE | Admit: 2013-07-21 | Discharge: 2013-07-21 | Disposition: A | Discharge status: Home / Self Care | Payer: PRIVATE HEALTH INSURANCE | Source: Ambulatory Visit | Attending: Family Medicine | Admitting: Family Medicine

## 2013-07-21 DIAGNOSIS — Z1231 Encounter for screening mammogram for malignant neoplasm of breast: Secondary | ICD-10-CM | POA: Insufficient documentation

## 2013-08-28 ENCOUNTER — Emergency Department (HOSPITAL_COMMUNITY)
Admission: EM | Admit: 2013-08-28 | Discharge: 2013-08-28 | Disposition: A | Discharge status: Home / Self Care | Payer: No Typology Code available for payment source | Attending: Emergency Medicine | Admitting: Emergency Medicine

## 2013-08-28 ENCOUNTER — Encounter (HOSPITAL_COMMUNITY): Payer: Self-pay | Admitting: Emergency Medicine

## 2013-08-28 DIAGNOSIS — M542 Cervicalgia: Secondary | ICD-10-CM

## 2013-08-28 DIAGNOSIS — M549 Dorsalgia, unspecified: Secondary | ICD-10-CM | POA: Insufficient documentation

## 2013-08-28 DIAGNOSIS — I10 Essential (primary) hypertension: Secondary | ICD-10-CM | POA: Insufficient documentation

## 2013-08-28 DIAGNOSIS — Y9389 Activity, other specified: Secondary | ICD-10-CM | POA: Insufficient documentation

## 2013-08-28 DIAGNOSIS — Y92410 Unspecified street and highway as the place of occurrence of the external cause: Secondary | ICD-10-CM | POA: Insufficient documentation

## 2013-08-28 DIAGNOSIS — Z79899 Other long term (current) drug therapy: Secondary | ICD-10-CM | POA: Insufficient documentation

## 2013-08-28 HISTORY — DX: Essential (primary) hypertension: I10

## 2013-08-28 MED ORDER — IBUPROFEN 800 MG PO TABS: 800.0000 mg | ORAL_TABLET | Freq: Three times a day (TID) | ORAL | Status: DC

## 2013-08-28 MED ORDER — CYCLOBENZAPRINE HCL 10 MG PO TABS: 10.0000 mg | ORAL_TABLET | Freq: Two times a day (BID) | ORAL | Status: DC | PRN

## 2013-08-28 MED ORDER — IBUPROFEN 800 MG PO TABS
800.0000 mg | ORAL_TABLET | Freq: Once | ORAL | Status: AC
  Administered 2013-08-28: 800 mg via ORAL
  Filled 2013-08-28: qty 1

## 2013-08-28 NOTE — ED Provider Notes (Signed)
  Medical screening examination/treatment/procedure(s) were performed by non-physician practitioner and as supervising physician I was immediately available for consultation/collaboration.     Gerhard Munch, MD 08/28/13 [phone]
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
## 2013-09-08 ENCOUNTER — Ambulatory Visit (INDEPENDENT_AMBULATORY_CARE_PROVIDER_SITE_OTHER): Payer: PRIVATE HEALTH INSURANCE | Admitting: Family Medicine

## 2013-09-08 ENCOUNTER — Encounter: Payer: Self-pay | Admitting: Family Medicine

## 2013-09-08 VITALS — BP 160/90 | HR 72 | Ht 61.0 in | Wt 184.0 lb

## 2013-09-08 DIAGNOSIS — I10 Essential (primary) hypertension: Secondary | ICD-10-CM

## 2013-09-08 MED ORDER — LOSARTAN POTASSIUM 50 MG PO TABS: 50.0000 mg | ORAL_TABLET | Freq: Every day | ORAL | Status: DC

## 2013-09-08 NOTE — Progress Notes (Signed)
Patient ID: Pamela Schneider    DOB: July 02, 1971, 42 y.o.   MRN: [id number] --- Subjective:  Pamela Schneider is a 42 y.o.female who presents for follow up on hypertension.  - Hypertension: takes HCTZ 25mg . She is pretty sure that she doesn't take a combination pill. She doesn't check BPs at home or at work. She denies any chest pain, shortness of breath, headache or lower extremity swelling. Missed doses per week: 0.  Had been on lisinopril but did not like how it made her feel.  - Health maintenance: last PAP: 12/04/09: normal. See Dr. Stefano Gaul as her OB/GYN doctor Mammogram: negative  ROS: see HPI Past Medical History: reviewed and updated medications and allergies. Social History: Tobacco: never smoker  Objective: Filed Vitals:   09/08/13 1443  BP: 160/90  Pulse: 72    Physical Examination:   General appearance - alert, well appearing, and in no distress Nose - normal and patent, no erythema, discharge or polyps Mouth - mucous membranes moist, pharynx normal without lesions Neck - supple, no significant adenopathy Chest - clear to auscultation, no wheezes, rales or rhonchi, symmetric air entry Heart - normal rate, regular rhythm, normal S1, S2, no murmurs, rubs, clicks or gallops Abdomen - soft, nontender, nondistended Extremities - no pedal edema.

## 2013-09-08 NOTE — Patient Instructions (Signed)
Please come back for fasting labs in 2 weeks. Do not eat or drink anything after midnight except water.  Ideally you can make an appointment with me at that same time. Call the office if the medicine states that you are taking hydrochlorothiazide and lisinopril.  For the elbow and knee pain, use ice, compression likely talked about, and ibuprofen 600 mg every 6 hours as needed for pain.  See you back in 2 weeks!

## 2013-09-12 NOTE — Assessment & Plan Note (Signed)
Not controlled. Currently only on hctz 25mg .  Start losartan and follow up BP in 2 weeks with repeat BMP and fastin lipid.

## 2013-09-22 ENCOUNTER — Other Ambulatory Visit: Payer: PRIVATE HEALTH INSURANCE

## 2013-09-23 ENCOUNTER — Other Ambulatory Visit: Payer: Self-pay | Admitting: Family Medicine

## 2013-09-28 ENCOUNTER — Ambulatory Visit (INDEPENDENT_AMBULATORY_CARE_PROVIDER_SITE_OTHER): Payer: PRIVATE HEALTH INSURANCE | Admitting: Family Medicine

## 2013-09-28 ENCOUNTER — Other Ambulatory Visit: Payer: Self-pay | Admitting: Family Medicine

## 2013-09-28 ENCOUNTER — Other Ambulatory Visit (INDEPENDENT_AMBULATORY_CARE_PROVIDER_SITE_OTHER): Payer: PRIVATE HEALTH INSURANCE

## 2013-09-28 ENCOUNTER — Encounter: Payer: Self-pay | Admitting: Family Medicine

## 2013-09-28 VITALS — BP 179/105 | HR 73 | Temp 98.2°F | Ht 61.0 in | Wt 185.4 lb

## 2013-09-28 DIAGNOSIS — I10 Essential (primary) hypertension: Secondary | ICD-10-CM

## 2013-09-28 DIAGNOSIS — J40 Bronchitis, not specified as acute or chronic: Secondary | ICD-10-CM

## 2013-09-28 LAB — COMPREHENSIVE METABOLIC PANEL
ALT: 16 U/L (ref 0–35)
Alkaline Phosphatase: 49 U/L (ref 39–117)
BUN: 14 mg/dL (ref 6–23)
CO2: 26 mEq/L (ref 19–32)
Calcium: 9.1 mg/dL (ref 8.4–10.5)
Chloride: 105 mEq/L (ref 96–112)
Creat: 0.76 mg/dL (ref 0.50–1.10)
Glucose, Bld: 99 mg/dL (ref 70–99)
Total Bilirubin: 0.3 mg/dL (ref 0.3–1.2)

## 2013-09-28 LAB — LIPID PANEL
Cholesterol: 120 mg/dL (ref 0–200)
HDL: 56 mg/dL (ref >39)
LDL Cholesterol: 52 mg/dL (ref 0–99)
Triglycerides: 60 mg/dL (ref <150)

## 2013-09-28 MED ORDER — OMEPRAZOLE 20 MG PO CPDR: 20.0000 mg | DELAYED_RELEASE_CAPSULE | Freq: Every day | ORAL | Status: DC

## 2013-09-28 MED ORDER — BENZONATATE 100 MG PO CAPS: 100.0000 mg | ORAL_CAPSULE | Freq: Three times a day (TID) | ORAL | Status: DC | PRN

## 2013-09-28 MED ORDER — AZITHROMYCIN 250 MG PO TABS: ORAL_TABLET | ORAL | Status: DC

## 2013-09-28 NOTE — Patient Instructions (Addendum)
For the cough and congestion, I think it is from a virus. I am sending a prescription for cough medicine. Also, try taking guaifenesin with dextromethorphan (Mucinex DM) 1-2 tablets every 12 hrs for the congestion.   If you have worsening breathing or worsening congestion by Friday, you can fill the prescription for the antibiotics.   For the blood pressure, please follow up in 2 weeks. In the meantime, the goals for exercise are:  - to 30 min of exercise 2-3 times per week.

## 2013-10-02 DIAGNOSIS — J40 Bronchitis, not specified as acute or chronic: Secondary | ICD-10-CM | POA: Insufficient documentation

## 2013-10-02 NOTE — Assessment & Plan Note (Signed)
Viral bronchitis: likely viral. No evidence of bacterial focus, but since this is the upcoming Thanksgiving Holiday and symptoms will have lasted almost one week, will give her Rx for azithromycin, in case by Friday she has not had an improvement or had worsening of symptoms. For now, treat symptoms with tessalon and guaifenesin and dextromethorphan

## 2013-10-02 NOTE — Assessment & Plan Note (Signed)
Hypertension: not well controlled today. Viral cough could be contributing to high BP. Discussed options with patient: increase meds now and recheck in 2 weeks vs hold on meds, start exercise, get over cold and check BP in 2 weeks. She chose to hold on increasing dose of losartan for now. Goals for exercise agreed upon: 20 minutes of walking and/or stairs 2-3 times per week during her lunch hour at work. If not better in 2 weeks, will increase losartan

## 2013-10-02 NOTE — Progress Notes (Signed)
Patient ID: Pamela Schneider    DOB: 1971/09/08, 42 y.o.   MRN: [id number] --- Subjective:  Pamela Schneider is a 42 y.o.female who presents for follow up on hypertension and also with complaint of chest congestion and cough.  - HYpertension: started taking losartan 1 week ago. Says she thinks she has muscle cramps on the left side of her thighs since she started. Also takes HCTZ 25mg  daily. Number of missed doses per week: 0, took medicine today. Doesn't check BP at home. No chest pain, no shortness of breath, no headache.   - Cough and chest congestion: started Friday. Dry cough. Sinus and chest congestion. Some rhinorrhea. No shortness of breath. Some pain in the chest due to congestion. Has been taking Tussin DM and daiquil without help. Denies associated headache. No fevers, no chills.    ROS: see HPI Past Medical History: reviewed and updated medications and allergies. Social History: Tobacco: none  Objective: Filed Vitals:   09/28/13 1052  BP: 179/105  Pulse: 73  Temp: 98.2 F (36.8 C)   Repeat: 166/96  Physical Examination:   General appearance - alert, well appearing, and in no distress Ears - bilateral TM's and external ear canals normal Nose - erythematous and congested nasal turbinates bilaterally Mouth - mucous membranes moist, pharynx normal without lesions Neck - supple, no significant adenopathy Chest - clear to auscultation, no wheezes, rales or rhonchi, symmetric air entry Heart - normal rate, regular rhythm, normal S1, S2, no murmurs, rubs, clicks or gallops Extremities - no pedal edema

## 2013-10-06 ENCOUNTER — Encounter: Payer: Self-pay | Admitting: Family Medicine

## 2013-10-20 ENCOUNTER — Ambulatory Visit: Payer: PRIVATE HEALTH INSURANCE | Admitting: Family Medicine

## 2013-11-08 ENCOUNTER — Ambulatory Visit: Payer: PRIVATE HEALTH INSURANCE | Admitting: Family Medicine

## 2013-11-21 ENCOUNTER — Other Ambulatory Visit: Payer: Self-pay | Admitting: Family Medicine

## 2013-12-28 ENCOUNTER — Other Ambulatory Visit: Payer: Self-pay | Admitting: *Deleted

## 2013-12-28 DIAGNOSIS — I10 Essential (primary) hypertension: Secondary | ICD-10-CM

## 2013-12-29 MED ORDER — LOSARTAN POTASSIUM 50 MG PO TABS: 50.0000 mg | ORAL_TABLET | Freq: Every day | ORAL | Status: DC

## 2013-12-29 MED ORDER — HYDROCHLOROTHIAZIDE 25 MG PO TABS: ORAL_TABLET | ORAL | Status: DC

## 2014-02-22 ENCOUNTER — Encounter: Payer: Self-pay | Admitting: Family Medicine

## 2014-02-22 ENCOUNTER — Ambulatory Visit (INDEPENDENT_AMBULATORY_CARE_PROVIDER_SITE_OTHER): Payer: PRIVATE HEALTH INSURANCE | Admitting: Family Medicine

## 2014-02-22 VITALS — BP 183/98 | HR 84 | Temp 98.4°F | Ht 61.0 in | Wt 188.5 lb

## 2014-02-22 DIAGNOSIS — I10 Essential (primary) hypertension: Secondary | ICD-10-CM

## 2014-02-22 DIAGNOSIS — R0989 Other specified symptoms and signs involving the circulatory and respiratory systems: Secondary | ICD-10-CM

## 2014-02-22 DIAGNOSIS — J329 Chronic sinusitis, unspecified: Secondary | ICD-10-CM

## 2014-02-22 MED ORDER — AMOXICILLIN-POT CLAVULANATE 875-125 MG PO TABS: 1.0000 | ORAL_TABLET | Freq: Two times a day (BID) | ORAL | Status: DC

## 2014-02-22 MED ORDER — IPRATROPIUM BROMIDE 0.06 % NA SOLN: 2.0000 | Freq: Four times a day (QID) | NASAL | Status: DC

## 2014-02-22 MED ORDER — HYDROCODONE-HOMATROPINE 5-1.5 MG/5ML PO SYRP: 5.0000 mL | ORAL_SOLUTION | Freq: Three times a day (TID) | ORAL | Status: DC | PRN

## 2014-02-22 NOTE — Patient Instructions (Signed)
I think you may have a sinusitis. We are going to treat this with the antibiotic augmentin.  For the congestion, we are going to use a spray called ipratropium.  For the cough, I am prescribing a syrup called hycodan to take mainly at night time but that you can take up to every 8 hours.  Please get the chest xray to make sure nothing is going on in your chest.   For the blood pressure, it is quite high today and it is very important that we see you back soon for this.  If your blood pressure is higher than 160/100 for more than two readings, please call the clinic and I will increase your losartan.  Check your blood pressure every day.  Take your blood pressure medicine today.  Follow up in 2 weeks.

## 2014-02-23 ENCOUNTER — Telehealth: Payer: Self-pay | Admitting: Family Medicine

## 2014-02-23 DIAGNOSIS — J329 Chronic sinusitis, unspecified: Secondary | ICD-10-CM | POA: Insufficient documentation

## 2014-02-23 DIAGNOSIS — I10 Essential (primary) hypertension: Secondary | ICD-10-CM

## 2014-02-23 MED ORDER — LOSARTAN POTASSIUM 50 MG PO TABS: 50.0000 mg | ORAL_TABLET | Freq: Every day | ORAL | Status: DC

## 2014-02-23 MED ORDER — HYDROCHLOROTHIAZIDE 25 MG PO TABS: 25.0000 mg | ORAL_TABLET | Freq: Every day | ORAL | Status: DC

## 2014-02-23 NOTE — Assessment & Plan Note (Addendum)
Uncontrolled. No evidence of end organ damage.  Elevated BP likely from poor compliance. Could also be related to acute illness. Patient very resistant to increase losartan.  - compromised with patient that she should check BP and if greater than 160/100 more than twice, she should call the clinic and I will increase dose of losartan. Stressed the importance of treating BP - patient to follow up in 10 days for follow up on BP.

## 2014-02-23 NOTE — Telephone Encounter (Signed)
PT was seen yesterday and received refills on some of her meds but didn't get one for the HCTZ.  Please call into pharmacy and inform her when completed

## 2014-02-23 NOTE — Progress Notes (Signed)
Patient ID: Pamela Schneider    DOB: Mar 19, 1971, 43 y.o.   MRN: [id number] --- Subjective:  Pamela Schneider is a 43 y.o.female with h/o HTN who presents for sinus pressure and congestion as well as HTN.  - sinus congestion: started 5 days ago. Started with itchy throat on Friday which has since resolved. Since then, she has had nasal congestion, sinus pressure, rhinorrhea. Cough productive of yellow sputum, worst at night time. No wheezing. No fever. No sick contacts. Symptoms are getting worst.  She has been taking flonase and allergy pills which have not helped.   - HTN: did not take medicine today. Denies any chest pain, shortness of breath, headache, double vision. No lower extremity edema.   ROS: see HPI Past Medical History: reviewed and updated medications and allergies. Social History: Tobacco: none  Objective: Filed Vitals:   02/22/14 1624  BP: 183/98  Pulse: 84  Temp: 98.4 F (36.9 C)    Physical Examination:   General appearance - alert, in no distress, appears uncomfortable from nasal congestion and rhinorrhea. Ears - bilateral TM's and external ear canals normal Nose - bilateral nasal congestion and erythema with discharge present, mild maxillary sinus tenderness bilaterally Mouth - mucous membranes moist, postnasal drip changes  Neck - supple, no significant adenopathy Chest - clear to auscultation, no wheezes, slightly decreased air movement on right compared to left.  Heart - normal rate, regular rhythm, normal S1, S2, no murmurs

## 2014-02-23 NOTE — Telephone Encounter (Signed)
Pt notified.  Pamela Schneider

## 2014-02-23 NOTE — Telephone Encounter (Signed)
Refilled hctz and losartan. Please let patient know. Thank you!  Liam Graham, PGY-3 Family Medicine Resident

## 2014-02-23 NOTE — Assessment & Plan Note (Addendum)
With worsening symptoms for more than 5 days, will treat for sinusitis with augmentin.  Will also treat congestion with ipratropium spray and cough with hycodan Because slight decrease in air movement on right compared to left, would like to get cxr

## 2014-02-27 ENCOUNTER — Ambulatory Visit
Admission: RE | Admit: 2014-02-27 | Discharge: 2014-02-27 | Disposition: A | Discharge status: Home / Self Care | Payer: PRIVATE HEALTH INSURANCE | Source: Ambulatory Visit | Attending: Family Medicine | Admitting: Family Medicine

## 2014-02-27 ENCOUNTER — Telehealth: Payer: Self-pay | Admitting: Family Medicine

## 2014-02-27 DIAGNOSIS — R0989 Other specified symptoms and signs involving the circulatory and respiratory systems: Secondary | ICD-10-CM

## 2014-02-27 NOTE — Telephone Encounter (Signed)
Patient still having a lot of the congestion that she was having last week. Would like to know if there is anything else she can take to help this. Please advise.

## 2014-02-27 NOTE — Telephone Encounter (Signed)
Called pt. She reports, that she is still congested. She finished her ABX. I advised the pt to buy OTC decongestants and ask the pharmacist which one to take due to her HTN. Also, advised to f/up with Dr.Losq for her HTN. Pt agreed. Fwd for info. Mauricia Area

## 2014-02-28 ENCOUNTER — Telehealth: Payer: Self-pay | Admitting: *Deleted

## 2014-02-28 ENCOUNTER — Telehealth: Payer: Self-pay | Admitting: Family Medicine

## 2014-02-28 NOTE — Telephone Encounter (Signed)
Blood pressure is still  High. Please advise.

## 2014-02-28 NOTE — Telephone Encounter (Signed)
Message copied by Lazaro Arms on Tue Feb 28, 2014 11:41 AM ------      Message from: Marin Olp      Created: Mon Feb 27, 2014  5:30 PM       Please inform patient of normal chest x-ray. ------

## 2014-02-28 NOTE — Telephone Encounter (Signed)
Message copied by Lazaro Arms on Tue Feb 28, 2014 11:39 AM ------      Message from: Marin Olp      Created: Mon Feb 27, 2014  5:30 PM       Please inform patient of normal chest x-ray. ------

## 2014-02-28 NOTE — Telephone Encounter (Signed)
Please send letter then. Thanks.

## 2014-02-28 NOTE — Telephone Encounter (Signed)
I have attempted to reach pt today and numbers listed are not working.  Please obtain correct phone number when pt calls back.   Will fwd to MD for advise.  OV note 02/23/2014 states possible increase to losartan.  [place]

## 2014-02-28 NOTE — Telephone Encounter (Signed)
Attempted to reach patient to notify of negative CXR, but phone numbers listed are not valid.  [place]

## 2014-02-28 NOTE — Telephone Encounter (Signed)
Pamela Schneider find out what blood pressure is running. If above 160/100 as listed in last note then will increase losartan to 100mg  daily.

## 2014-03-02 NOTE — Telephone Encounter (Signed)
Pt called back and was given the information below and at the time Dr. Yong Channel was her and told her that she can take 2 losartan 50 mg and 1 of the hydrochlorothiazide daily and to follow up next week. He also said that if it doesn't lower her BP to come in sooner. jw

## 2014-03-02 NOTE — Telephone Encounter (Signed)
Called pt and LMVM to call us back. Please see message. Thanks. Mauricia Area

## 2014-03-06 ENCOUNTER — Ambulatory Visit (INDEPENDENT_AMBULATORY_CARE_PROVIDER_SITE_OTHER): Payer: PRIVATE HEALTH INSURANCE | Admitting: Family Medicine

## 2014-03-06 ENCOUNTER — Encounter: Payer: Self-pay | Admitting: Family Medicine

## 2014-03-06 VITALS — BP 170/110 | HR 98 | Temp 99.0°F | Wt 187.0 lb

## 2014-03-06 DIAGNOSIS — I10 Essential (primary) hypertension: Secondary | ICD-10-CM

## 2014-03-06 MED ORDER — AMLODIPINE BESYLATE 10 MG PO TABS: 10.0000 mg | ORAL_TABLET | Freq: Every day | ORAL | Status: DC

## 2014-03-06 MED ORDER — LOSARTAN POTASSIUM 100 MG PO TABS: 100.0000 mg | ORAL_TABLET | Freq: Every day | ORAL | Status: DC

## 2014-03-06 NOTE — Patient Instructions (Addendum)
Dear Mauricia Area, Thank you for coming in to clinic today. It was good to meet you!  Today we discussed your Blood Pressure. 1. Your BP is still elevated 170 / 110, re-checked at 160/90 2. I recommend resuming Norvasc 10mg  daily (in addition to Losartan 100mg  daily and HCTZ 25mg  daily). I have sent new rx to your pharmacy for 10mg  tabs, you may start with 5mg  (cut tab in half) for 3 to 5 days, if tolerating then go up to 10 mg total 3. I sent refill for Losartan 100mg  tabs (instead of prior 50mg  tabs) 4. Great job with your lifestyle changes! Do your best to continue walking nightly, and continue to reduce the salt (by reducing prepared or frozen foods). Keep up the good work!  Please schedule a follow-up appointment with Dr. Otis Dials in 1 month, alternatively you could schedule a nurse visit in 2 to 4 weeks for a Nurse Only BP re-check.  If you have any other questions or concerns, please feel free to call the clinic to contact me. You may also schedule an earlier appointment if necessary.  However, if your symptoms get significantly worse, please go to the Emergency Department to seek immediate medical attention.  Nobie Putnam, [place]

## 2014-03-06 NOTE — Progress Notes (Signed)
Subjective:     Patient ID: Pamela Schneider, female   DOB: [birth year], 43 y.o.   MRN: [id number]  HPI  CHRONIC HTN: - Recently started checking BP at work (nurses available to check BP) - Called on Thursday with elevated BP, recommended to take Losartan 50mg  x 2 daily = 100mg  - Additionally, pt reports to taking Norvasc 10mg  years ago with intolerance due to cramps / back ache, and similar complaint with Lisinopril, has not been on these meds x 2-3 years Current Meds - Losartan 50mg  x 2 daily, HCTZ 25mg  daily Reports good compliance, took meds today. Tolerating well, w/o complaints. 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 Normal work of breathing. Ext - non-tender, no edema, peripheral pulses intact +2 b/l Neuro - awake, alert, oriented, grossly non-focal with intact CN-II-XII, intact muscle strength 5/5 b/l, intact distal sensation to light touch, gait normal     Assessment:     See specific A&P problem list for details.      Plan:     See specific A&P problem list for details.

## 2014-03-06 NOTE — Assessment & Plan Note (Signed)
Poorly controlled HTN, re-checked manually at 932/35 No complications   Plan:  1. Add Amlodipine 10mg  daily (start with 5mg , cut pills in half x 3-5 days to see if tolerates, then inc to 10mg  daily), discussed side effects, well tolerated, patient agreeable to re-trial 2. Refilled Losartan at 100mg  tablets x 1 daily, continue HCTZ 25mg  daily 3. Lifestyle Mods - Continue current goals - inc walk, dec prepared foods, inc fresh veg 4. Monitor BP at work 3x weekly (nurse checks) 5. RTC 1 month for Dr. Otis Dials, or if unable to, may schedule nurse only BP check 2-4 weeks, then f/u with Dr. Otis Dials

## 2014-09-01 ENCOUNTER — Other Ambulatory Visit: Payer: Self-pay | Admitting: Family Medicine

## 2014-09-01 ENCOUNTER — Other Ambulatory Visit: Payer: Self-pay | Admitting: *Deleted

## 2014-09-01 MED ORDER — OMEPRAZOLE 20 MG PO CPDR: 20.0000 mg | DELAYED_RELEASE_CAPSULE | Freq: Every day | ORAL | Status: DC

## 2015-01-20 ENCOUNTER — Other Ambulatory Visit: Payer: Self-pay | Admitting: Family Medicine

## 2015-01-20 DIAGNOSIS — K219 Gastro-esophageal reflux disease without esophagitis: Secondary | ICD-10-CM

## 2015-03-27 ENCOUNTER — Other Ambulatory Visit: Payer: Self-pay | Admitting: *Deleted

## 2015-03-29 ENCOUNTER — Other Ambulatory Visit: Payer: Self-pay | Admitting: Family Medicine

## 2015-03-29 ENCOUNTER — Encounter: Payer: Self-pay | Admitting: *Deleted

## 2015-03-29 DIAGNOSIS — I10 Essential (primary) hypertension: Secondary | ICD-10-CM

## 2015-03-29 MED ORDER — LOSARTAN POTASSIUM 100 MG PO TABS: 100.0000 mg | ORAL_TABLET | Freq: Every day | ORAL | Status: DC

## 2015-03-29 MED ORDER — HYDROCHLOROTHIAZIDE 25 MG PO TABS: 25.0000 mg | ORAL_TABLET | Freq: Every day | ORAL | Status: DC

## 2015-03-29 NOTE — Telephone Encounter (Signed)
This encounter was created in error - please disregard.

## 2015-03-29 NOTE — Telephone Encounter (Signed)
Checking status of refill request they sent on 5/23 for HCTZ and losartan

## 2015-04-25 ENCOUNTER — Encounter: Payer: PRIVATE HEALTH INSURANCE | Admitting: Family Medicine

## 2015-05-24 ENCOUNTER — Encounter: Payer: PRIVATE HEALTH INSURANCE | Admitting: Family Medicine

## 2015-10-08 ENCOUNTER — Other Ambulatory Visit: Payer: Self-pay | Admitting: *Deleted

## 2015-10-08 DIAGNOSIS — J309 Allergic rhinitis, unspecified: Secondary | ICD-10-CM

## 2015-10-08 MED ORDER — IPRATROPIUM BROMIDE 0.06 % NA SOLN: 2.0000 | Freq: Four times a day (QID) | NASAL | Status: DC

## 2015-10-09 ENCOUNTER — Other Ambulatory Visit (HOSPITAL_COMMUNITY)
Admission: RE | Admit: 2015-10-09 | Discharge: 2015-10-09 | Disposition: A | Discharge status: Home / Self Care | Payer: PRIVATE HEALTH INSURANCE | Source: Ambulatory Visit | Attending: Family Medicine | Admitting: Family Medicine

## 2015-10-09 ENCOUNTER — Ambulatory Visit (INDEPENDENT_AMBULATORY_CARE_PROVIDER_SITE_OTHER): Payer: PRIVATE HEALTH INSURANCE | Admitting: Family Medicine

## 2015-10-09 ENCOUNTER — Encounter: Payer: Self-pay | Admitting: Family Medicine

## 2015-10-09 VITALS — BP 195/89 | HR 94 | Temp 98.8°F | Ht 61.0 in | Wt 191.7 lb

## 2015-10-09 DIAGNOSIS — Z124 Encounter for screening for malignant neoplasm of cervix: Secondary | ICD-10-CM

## 2015-10-09 DIAGNOSIS — I10 Essential (primary) hypertension: Secondary | ICD-10-CM

## 2015-10-09 DIAGNOSIS — Z114 Encounter for screening for human immunodeficiency virus [HIV]: Secondary | ICD-10-CM

## 2015-10-09 DIAGNOSIS — Z113 Encounter for screening for infections with a predominantly sexual mode of transmission: Secondary | ICD-10-CM | POA: Insufficient documentation

## 2015-10-09 DIAGNOSIS — Z1151 Encounter for screening for human papillomavirus (HPV): Secondary | ICD-10-CM | POA: Insufficient documentation

## 2015-10-09 DIAGNOSIS — M25562 Pain in left knee: Secondary | ICD-10-CM

## 2015-10-09 DIAGNOSIS — Z0000 Encounter for general adult medical examination without abnormal findings: Secondary | ICD-10-CM | POA: Diagnosis not present

## 2015-10-09 DIAGNOSIS — M25561 Pain in right knee: Secondary | ICD-10-CM | POA: Insufficient documentation

## 2015-10-09 DIAGNOSIS — Z01419 Encounter for gynecological examination (general) (routine) without abnormal findings: Secondary | ICD-10-CM

## 2015-10-09 LAB — BASIC METABOLIC PANEL WITH GFR
BUN: 13 mg/dL (ref 7–25)
CALCIUM: 9 mg/dL (ref 8.6–10.2)
CO2: 29 mmol/L (ref 20–31)
CREATININE: 0.83 mg/dL (ref 0.50–1.10)
Chloride: 100 mmol/L (ref 98–110)
GFR, Est Non African American: 85 mL/min (ref >=60)
Glucose, Bld: 89 mg/dL (ref 65–99)
POTASSIUM: 3.3 mmol/L — AB (ref 3.5–5.3)
Sodium: 136 mmol/L (ref 135–146)

## 2015-10-09 LAB — CBC
HCT: 33.8 % — ABNORMAL LOW (ref 36.0–46.0)
Hemoglobin: 10.5 g/dL — ABNORMAL LOW (ref 12.0–15.0)
MCH: 20.3 pg — ABNORMAL LOW (ref 26.0–34.0)
MCHC: 31.1 g/dL (ref 30.0–36.0)
MCV: 65.5 fL — ABNORMAL LOW (ref 78.0–100.0)
PLATELETS: 264 10*3/uL (ref 150–400)
RBC: 5.16 MIL/uL — ABNORMAL HIGH (ref 3.87–5.11)
RDW: 17.5 % — AB (ref 11.5–15.5)
WBC: 5.4 10*3/uL (ref 4.0–10.5)

## 2015-10-09 LAB — LIPID PANEL
CHOL/HDL RATIO: 2.2 ratio (ref <=5.0)
CHOLESTEROL: 121 mg/dL — AB (ref 125–200)
HDL: 54 mg/dL (ref >=46)
LDL Cholesterol: 45 mg/dL (ref <130)
Triglycerides: 111 mg/dL (ref <150)
VLDL: 22 mg/dL (ref <30)

## 2015-10-09 MED ORDER — NIFEDIPINE ER OSMOTIC RELEASE 30 MG PO TB24: 30.0000 mg | ORAL_TABLET | Freq: Every day | ORAL | Status: DC

## 2015-10-09 MED ORDER — DICLOFENAC SODIUM 75 MG PO TBEC: 75.0000 mg | DELAYED_RELEASE_TABLET | Freq: Two times a day (BID) | ORAL | Status: DC | PRN

## 2015-10-09 NOTE — Patient Instructions (Signed)
Health Maintenance, Female Adopting a healthy lifestyle and getting preventive care can go a long way to promote health and wellness. Talk with your health care provider about what schedule of regular examinations is right for you. This is a good chance for you to check in with your provider about disease prevention and staying healthy. In between checkups, there are plenty of things you can do on your own. Experts have done a lot of research about which lifestyle changes and preventive measures are most likely to keep you healthy. Ask your health care provider for more information. WEIGHT AND DIET  Eat a healthy diet  Be sure to include plenty of vegetables, fruits, low-fat dairy products, and lean protein.  Do not eat a lot of foods high in solid fats, added sugars, or salt.  Get regular exercise. This is one of the most important things you can do for your health.  Most adults should exercise for at least 150 minutes each week. The exercise should increase your heart rate and make you sweat (moderate-intensity exercise).  Most adults should also do strengthening exercises at least twice a week. This is in addition to the moderate-intensity exercise.  Maintain a healthy weight  Body mass index (BMI) is a measurement that can be used to identify possible weight problems. It estimates body fat based on height and weight. Your health care provider can help determine your BMI and help you achieve or maintain a healthy weight.  For females 20 years of age and older:   A BMI below 18.5 is considered underweight.  A BMI of 18.5 to 24.9 is normal.  A BMI of 25 to 29.9 is considered overweight.  A BMI of 30 and above is considered obese.  Watch levels of cholesterol and blood lipids  You should start having your blood tested for lipids and cholesterol at 45 years of age, then have this test every 5 years.  You may need to have your cholesterol levels checked more often if:  Your lipid  or cholesterol levels are high.  You are older than 45 years of age.  You are at high risk for heart disease.  CANCER SCREENING   Lung Cancer  Lung cancer screening is recommended for adults 55-80 years old who are at high risk for lung cancer because of a history of smoking.  A yearly low-dose CT scan of the lungs is recommended for people who:  Currently smoke.  Have quit within the past 15 years.  Have at least a 30-pack-year history of smoking. A pack year is smoking an average of one pack of cigarettes a day for 1 year.  Yearly screening should continue until it has been 15 years since you quit.  Yearly screening should stop if you develop a health problem that would prevent you from having lung cancer treatment.  Breast Cancer  Practice breast self-awareness. This means understanding how your breasts normally appear and feel.  It also means doing regular breast self-exams. Let your health care provider know about any changes, no matter how small.  If you are in your 20s or 30s, you should have a clinical breast exam (CBE) by a health care provider every 1-3 years as part of a regular health exam.  If you are 40 or older, have a CBE every year. Also consider having a breast X-ray (mammogram) every year.  If you have a family history of breast cancer, talk to your health care provider about genetic screening.  If you   are at high risk for breast cancer, talk to your health care provider about having an MRI and a mammogram every year.  Breast cancer gene (BRCA) assessment is recommended for women who have family members with BRCA-related cancers. BRCA-related cancers include:  Breast.  Ovarian.  Tubal.  Peritoneal cancers.  Results of the assessment will determine the need for genetic counseling and BRCA1 and BRCA2 testing. Cervical Cancer Your health care provider may recommend that you be screened regularly for cancer of the pelvic organs (ovaries, uterus, and  vagina). This screening involves a pelvic examination, including checking for microscopic changes to the surface of your cervix (Pap test). You may be encouraged to have this screening done every 3 years, beginning at age 21.  For women ages 30-65, health care providers may recommend pelvic exams and Pap testing every 3 years, or they may recommend the Pap and pelvic exam, combined with testing for human papilloma virus (HPV), every 5 years. Some types of HPV increase your risk of cervical cancer. Testing for HPV may also be done on women of any age with unclear Pap test results.  Other health care providers may not recommend any screening for nonpregnant women who are considered low risk for pelvic cancer and who do not have symptoms. Ask your health care provider if a screening pelvic exam is right for you.  If you have had past treatment for cervical cancer or a condition that could lead to cancer, you need Pap tests and screening for cancer for at least 20 years after your treatment. If Pap tests have been discontinued, your risk factors (such as having a new sexual partner) need to be reassessed to determine if screening should resume. Some women have medical problems that increase the chance of getting cervical cancer. In these cases, your health care provider may recommend more frequent screening and Pap tests. Colorectal Cancer  This type of cancer can be detected and often prevented.  Routine colorectal cancer screening usually begins at 45 years of age and continues through 45 years of age.  Your health care provider may recommend screening at an earlier age if you have risk factors for colon cancer.  Your health care provider may also recommend using home test kits to check for hidden blood in the stool.  A small camera at the end of a tube can be used to examine your colon directly (sigmoidoscopy or colonoscopy). This is done to check for the earliest forms of colorectal  cancer.  Routine screening usually begins at age 50.  Direct examination of the colon should be repeated every 5-10 years through 45 years of age. However, you may need to be screened more often if early forms of precancerous polyps or small growths are found. Skin Cancer  Check your skin from head to toe regularly.  Tell your health care provider about any new moles or changes in moles, especially if there is a change in a mole's shape or color.  Also tell your health care provider if you have a mole that is larger than the size of a pencil eraser.  Always use sunscreen. Apply sunscreen liberally and repeatedly throughout the day.  Protect yourself by wearing long sleeves, pants, a wide-brimmed hat, and sunglasses whenever you are outside. HEART DISEASE, DIABETES, AND HIGH BLOOD PRESSURE   High blood pressure causes heart disease and increases the risk of stroke. High blood pressure is more likely to develop in:  People who have blood pressure in the high end   of the normal range (130-139/85-89 mm Hg).  People who are overweight or obese.  People who are African American.  If you are 38-23 years of age, have your blood pressure checked every 3-5 years. If you are 61 years of age or older, have your blood pressure checked every year. You should have your blood pressure measured twice--once when you are at a hospital or clinic, and once when you are not at a hospital or clinic. Record the average of the two measurements. To check your blood pressure when you are not at a hospital or clinic, you can use:  An automated blood pressure machine at a pharmacy.  A home blood pressure monitor.  If you are between 45 years and 39 years old, ask your health care provider if you should take aspirin to prevent strokes.  Have regular diabetes screenings. This involves taking a blood sample to check your fasting blood sugar level.  If you are at a normal weight and have a low risk for diabetes,  have this test once every three years after 45 years of age.  If you are overweight and have a high risk for diabetes, consider being tested at a younger age or more often. PREVENTING INFECTION  Hepatitis B  If you have a higher risk for hepatitis B, you should be screened for this virus. You are considered at high risk for hepatitis B if:  You were born in a country where hepatitis B is common. Ask your health care provider which countries are considered high risk.  Your parents were born in a high-risk country, and you have not been immunized against hepatitis B (hepatitis B vaccine).  You have HIV or AIDS.  You use needles to inject street drugs.  You live with someone who has hepatitis B.  You have had sex with someone who has hepatitis B.  You get hemodialysis treatment.  You take certain medicines for conditions, including cancer, organ transplantation, and autoimmune conditions. Hepatitis C  Blood testing is recommended for:  Everyone born from [birth year] through 1966.  Anyone with known risk factors for hepatitis C. Sexually transmitted infections (STIs)  You should be screened for sexually transmitted infections (STIs) including gonorrhea and chlamydia if:  You are sexually active and are younger than 45 years of age.  You are older than 45 years of age and your health care provider tells you that you are at risk for this type of infection.  Your sexual activity has changed since you were last screened and you are at an increased risk for chlamydia or gonorrhea. Ask your health care provider if you are at risk.  If you do not have HIV, but are at risk, it may be recommended that you take a prescription medicine daily to prevent HIV infection. This is called pre-exposure prophylaxis (PrEP). You are considered at risk if:  You are sexually active and do not regularly use condoms or know the HIV status of your partner(s).  You take drugs by injection.  You are sexually  active with a partner who has HIV. Talk with your health care provider about whether you are at high risk of being infected with HIV. If you choose to begin PrEP, you should first be tested for HIV. You should then be tested every 3 months for as long as you are taking PrEP.  PREGNANCY   If you are premenopausal and you may become pregnant, ask your health care provider about preconception counseling.  If you may  become pregnant, take 400 to 800 micrograms (mcg) of folic acid every day.  If you want to prevent pregnancy, talk to your health care provider about birth control (contraception). OSTEOPOROSIS AND MENOPAUSE   Osteoporosis is a disease in which the bones lose minerals and strength with aging. This can result in serious bone fractures. Your risk for osteoporosis can be identified using a bone density scan.  If you are 61 years of age or older, or if you are at risk for osteoporosis and fractures, ask your health care provider if you should be screened.  Ask your health care provider whether you should take a calcium or vitamin D supplement to lower your risk for osteoporosis.  Menopause may have certain physical symptoms and risks.  Hormone replacement therapy may reduce some of these symptoms and risks. Talk to your health care provider about whether hormone replacement therapy is right for you.  HOME CARE INSTRUCTIONS   Schedule regular health, dental, and eye exams.  Stay current with your immunizations.   Do not use any tobacco products including cigarettes, chewing tobacco, or electronic cigarettes.  If you are pregnant, do not drink alcohol.  If you are breastfeeding, limit how much and how often you drink alcohol.  Limit alcohol intake to no more than 1 drink per day for nonpregnant women. One drink equals 12 ounces of beer, 5 ounces of wine, or 1 ounces of hard liquor.  Do not use street drugs.  Do not share needles.  Ask your health care provider for help if  you need support or information about quitting drugs.  Tell your health care provider if you often feel depressed.  Tell your health care provider if you have ever been abused or do not feel safe at home.   This information is not intended to replace advice given to you by your health care provider. Make sure you discuss any questions you have with your health care provider.   Document Released: 05/05/2011 Document Revised: 11/10/2014 Document Reviewed: 09/21/2013 Elsevier Interactive Patient Education [employer].

## 2015-10-10 LAB — HIV ANTIBODY (ROUTINE TESTING W REFLEX): HIV 1&2 Ab, 4th Generation: NONREACTIVE

## 2015-10-10 LAB — CYTOLOGY - PAP

## 2015-10-12 NOTE — Assessment & Plan Note (Signed)
195/89, same on manual recheck. Stopped amlodipine d/t SE but reports compliance with others - continue losartan and hctz - add nifedipine - f/u in 2 weeks - discussed case with Koval and agreed another CCB would likely be best for her since the SE she complained of was fatigue

## 2015-10-12 NOTE — Assessment & Plan Note (Addendum)
Achy knees for years, worse with age, sounds like OA, exam normal except for crepitus - trial of voltaran, discussed possible steroid injection in the future - rec ice packs and compression

## 2015-10-13 NOTE — Progress Notes (Signed)
   Subjective:   Pamela Schneider is a 44 y.o. female with a history of htn here for well woman exam  CHRONIC HYPERTENSION  Disease Monitoring  Blood pressure range: 195/89  Chest pain: no   Dyspnea: no   Claudication: no   Medication compliance: no, stopped amlodipine due to feeling tired/run down  Medication Side Effects  Lightheadedness: no   Urinary frequency: no   Edema: no   Impotence: no   Preventitive Healthcare:  Exercise: no, occasional walking   Diet Pattern: well balanced   Salt Restriction: sometimes  Pt also complaining of bilateral knee pain the has been a dull ache for years but is worsening over the past few months. Worse with walking. She reports that aleve doesn't help  Review of Systems:  Per HPI. All other systems reviewed and are negative.   PMH, PSH, Medications, Allergies, and FmHx reviewed and updated in EMR.  Social History: never smoker  Objective:  BP 195/89 mmHg  Pulse 94  Temp(Src) 98.8 F (37.1 C) (Oral)  Ht 5\' 1"  (1.549 m)  Wt 191 lb 11.2 oz (86.955 kg)  BMI 36.24 kg/m2  Gen:  44 y.o. female in NAD HEENT: NCAT, MMM, EOMI, PERRL, anicteric sclerae CV: RRR, no MRG, no JVD Resp: Non-labored, CTAB, no wheezes noted Abd: Soft, NTND, BS present, no guarding or organomegaly Ext: WWP, no edema, normal knee exam without tenderness MSK: Full ROM, strength intact Neuro: Alert and oriented, speech normal GU: normal vagina, cervix, enlarged uterus on bimanual exam      Chemistry      Component Value Date/Time   NA 136 10/09/2015 1418   K 3.3* 10/09/2015 1418   CL 100 10/09/2015 1418   CO2 29 10/09/2015 1418   BUN 13 10/09/2015 1418   CREATININE 0.83 10/09/2015 1418   CREATININE 0.90 08/19/2010 2048      Component Value Date/Time   CALCIUM 9.0 10/09/2015 1418   ALKPHOS 49 09/28/2013 1051   AST 16 09/28/2013 1051   ALT 16 09/28/2013 1051   BILITOT 0.3 09/28/2013 1051      Lab Results  Component Value Date   WBC 5.4  10/09/2015   HGB 10.5* 10/09/2015   HCT 33.8* 10/09/2015   MCV 65.5* 10/09/2015   PLT 264 10/09/2015   Lab Results  Component Value Date   TSH 1.088 08/12/2007   No results found for: HGBA1C Assessment & Plan:     Pamela Schneider is a 44 y.o. female here for WWE  HYPERTENSION, BENIGN SYSTEMIC 195/89, same on manual recheck. Stopped amlodipine d/t SE but reports compliance with others - continue losartan and hctz - add nifedipine - f/u in 2 weeks - discussed case with Koval and agreed another CCB would likely be best for her since the SE she complained of was fatigue  Knee pain, bilateral Achy knees for years, worse with age, sounds like OA, exam normal except for crepitus - trial of voltaran, discussed possible steroid injection in the future - rec ice packs and compression  Well woman exam Pap today, STD screening Pt to schedule mammogram Declines flu shot Otherwise health maintenance UTD       Beverlyn Roux, MD, MPH [facility] PGY-3 10/13/2015 10:59 AM

## 2015-10-13 NOTE — Assessment & Plan Note (Signed)
Pap today, STD screening Pt to schedule mammogram Declines flu shot Otherwise health maintenance UTD

## 2015-10-19 ENCOUNTER — Telehealth: Payer: Self-pay | Admitting: Family Medicine

## 2015-10-19 NOTE — Telephone Encounter (Signed)
Patient is requesting lab results.

## 2016-02-18 ENCOUNTER — Other Ambulatory Visit: Payer: Self-pay | Admitting: Family Medicine

## 2016-02-18 DIAGNOSIS — K219 Gastro-esophageal reflux disease without esophagitis: Secondary | ICD-10-CM

## 2016-05-19 ENCOUNTER — Other Ambulatory Visit: Payer: Self-pay | Admitting: Family Medicine

## 2016-06-06 ENCOUNTER — Other Ambulatory Visit: Payer: Self-pay | Admitting: Family Medicine

## 2016-06-06 DIAGNOSIS — Z1231 Encounter for screening mammogram for malignant neoplasm of breast: Secondary | ICD-10-CM

## 2016-06-17 ENCOUNTER — Ambulatory Visit
Admission: RE | Admit: 2016-06-17 | Discharge: 2016-06-17 | Disposition: A | Discharge status: Home / Self Care | Payer: PRIVATE HEALTH INSURANCE | Source: Ambulatory Visit | Attending: Family Medicine | Admitting: Family Medicine

## 2016-06-17 DIAGNOSIS — Z1231 Encounter for screening mammogram for malignant neoplasm of breast: Secondary | ICD-10-CM

## 2016-10-03 ENCOUNTER — Other Ambulatory Visit: Payer: Self-pay | Admitting: Internal Medicine

## 2016-11-06 ENCOUNTER — Other Ambulatory Visit: Payer: Self-pay | Admitting: Internal Medicine

## 2016-11-06 DIAGNOSIS — I10 Essential (primary) hypertension: Secondary | ICD-10-CM

## 2016-11-06 NOTE — Telephone Encounter (Signed)
Pt needs a refill on nifedipine. Pt uses [facility] on [address]. ep

## 2016-11-07 MED ORDER — NIFEDIPINE ER OSMOTIC RELEASE 30 MG PO TB24: 30.0000 mg | ORAL_TABLET | Freq: Every day | ORAL | 2 refills | Status: DC

## 2016-11-20 ENCOUNTER — Encounter: Payer: PRIVATE HEALTH INSURANCE | Admitting: Internal Medicine

## 2017-02-03 ENCOUNTER — Ambulatory Visit (INDEPENDENT_AMBULATORY_CARE_PROVIDER_SITE_OTHER): Payer: PRIVATE HEALTH INSURANCE | Admitting: Internal Medicine

## 2017-02-03 ENCOUNTER — Encounter: Payer: Self-pay | Admitting: Internal Medicine

## 2017-02-03 VITALS — BP 178/84 | HR 98 | Temp 98.4°F | Wt 188.0 lb

## 2017-02-03 DIAGNOSIS — R29898 Other symptoms and signs involving the musculoskeletal system: Secondary | ICD-10-CM | POA: Diagnosis not present

## 2017-02-03 DIAGNOSIS — I10 Essential (primary) hypertension: Secondary | ICD-10-CM

## 2017-02-03 DIAGNOSIS — N852 Hypertrophy of uterus: Secondary | ICD-10-CM | POA: Insufficient documentation

## 2017-02-03 DIAGNOSIS — J302 Other seasonal allergic rhinitis: Secondary | ICD-10-CM

## 2017-02-03 DIAGNOSIS — G8929 Other chronic pain: Secondary | ICD-10-CM | POA: Diagnosis not present

## 2017-02-03 DIAGNOSIS — M25562 Pain in left knee: Secondary | ICD-10-CM

## 2017-02-03 DIAGNOSIS — Z23 Encounter for immunization: Secondary | ICD-10-CM | POA: Diagnosis not present

## 2017-02-03 DIAGNOSIS — M25561 Pain in right knee: Secondary | ICD-10-CM

## 2017-02-03 MED ORDER — HYDROCHLOROTHIAZIDE 25 MG PO TABS: 25.0000 mg | ORAL_TABLET | Freq: Every day | ORAL | 3 refills | Status: DC

## 2017-02-03 MED ORDER — DICLOFENAC SODIUM 75 MG PO TBEC: DELAYED_RELEASE_TABLET | ORAL | 1 refills | Status: DC

## 2017-02-03 MED ORDER — NIFEDIPINE ER OSMOTIC RELEASE 30 MG PO TB24: 30.0000 mg | ORAL_TABLET | Freq: Every day | ORAL | 3 refills | Status: DC

## 2017-02-03 MED ORDER — FLUTICASONE PROPIONATE 50 MCG/ACT NA SUSP: 1.0000 | Freq: Every day | NASAL | 5 refills | Status: DC

## 2017-02-03 MED ORDER — ATORVASTATIN CALCIUM 20 MG PO TABS: 20.0000 mg | ORAL_TABLET | Freq: Every day | ORAL | 3 refills | Status: DC

## 2017-02-03 NOTE — Assessment & Plan Note (Signed)
-   Recommended increasing flexibility with stretching exercises/yoga

## 2017-02-03 NOTE — Patient Instructions (Signed)
Ms. Pamela Schneider,  I recommended restarting hydrochlorothiazide in addition to your nifedipine. Please follow-up in about 2 weeks to see how your blood pressure is doing.  I have prescribed atorvastatin to reduce risk of stroke/heart attack given your high blood pressure.  If your electrolytes look abnormal, I will give you a call.  Due to enlarged uterus, I have ordered ultrasound to investigate further.  Keep up the good work with exercise!! I refilled diclofenac for knee pain.  Best, Dr. Ola Spurr

## 2017-02-03 NOTE — Assessment & Plan Note (Addendum)
-   Poorly controlled.  - Continue nifedipine 30 mg and restart HCTZ 25 mg daily - Anticipate needing to add another agent - If unable to be controlled on 3 medications would get renal artery [place] - Check CMP - Start on atorvastatin 20 mg, given elevated risk of heart attack and stroke with chronically elevated BP despite low cholesterol on last lipid panel - Encouraged low salt diet, cutting out processed foods

## 2017-02-03 NOTE — Progress Notes (Signed)
[name] Progress Note  Subjective:  Pamela Schneider is a 46 y.o. female who presents for annual exam. She has history of hypertension. She reports concern of neck tightness.  #Hypertension: - Has only been taking nifedipine 30 mg daily since last OV in 2016. At that time was told to take this, losartan, and HCTZ for poorly controlled HTN. Was switched from amlodipine to nifedipine due to SE of fatigue. Says she didn't continue taking HCTZ due to some cramping but could not exactly remember why.  - Is trying to improve her diet; has cut out chips and increasing vegetable intake - Mother and father also have history of HTN - Has started to walk for 15-20 minutes a day but would like to work up to 40 minutes a day - ASCVD 10-year risk score was 9.6% based on BP and cholesterol checked in 2016 ROS: Has occasional headaches, denies vision changes  #Neck tightness: - Wakes up with neck stiffness most of the time, thinks it may be due to how she sleeps - Ongoing for months - Showering eases off the pain - Naproxen helps some - Worse with menstruation  #Knee pain: - Chronic. L > R. Improved by diclofenac but ran out. Does not limit from being active.   Social: Never smoker; drinks 1-2 alcoholic beverages 2-3 times a week; does not use illegal drugs; feels safe in her relationships; works as a Electrical engineer for [employer]: Due for mammogram 06/2017 and pap smear 10/2018. Needs TDAP.   No Known Allergies  Objective: Blood pressure (!) 178/84, pulse 98, temperature 98.4 F (36.9 C), temperature source Oral, weight 188 lb (85.3 kg), last menstrual period 01/28/2017. Body mass index is 35.52 kg/m. Constitutional: Obese female, in NAD HENT: Normal posterior oropharynx, swollen and erythematous nasal turbinates Cardiovascular: RRR, S1, S2, no m/r/g. No abdominal bruit appreciated Pulmonary/Chest: Effort normal and breath sounds normal. No  respiratory distress.  Abdominal: Soft. +BS, NT, ND, enlarged uterus felt just below umbilicus Musculoskeletal: Crepitus of bilateral knees but FROM and no joint line TTP. No TTP over midline spine or cervical paraspinal muscles but patient reported soreness when turning head left or right. No pain when touching chin to chest.  Neurological: AOx3, no focal deficits. Skin: Skin is warm and dry. No rash noted. No erythema.  Psychiatric: Normal mood and affect.  Vitals reviewed  PHQ-2: 0  Assessment/Plan: HYPERTENSION, BENIGN SYSTEMIC - Poorly controlled.  - Continue nifedipine 30 mg and restart HCTZ 25 mg daily - Anticipate needing to add another agent - If unable to be controlled on 3 medications would get renal artery [place] - Check CMP - Start on atorvastatin 20 mg, given elevated risk of heart attack and stroke with chronically elevated BP despite low cholesterol on last lipid panel - Encouraged low salt diet, cutting out processed foods  Neck tightness - Recommended increasing flexibility with stretching exercises/yoga  Knee pain, bilateral - Chronic. Suspect OA. - Recommended weight loss and refilled patient's PO diclofenac to take prn  Enlarged uterus - Noted also with patient's last bimanual exam in 2016. Normal pap at that time. Patient denies heavy periods or abdominal cramping. No change in weight. Suspect fibroids but cannot r/o mass. - Ordered pelvic and transvaginal US to further evaluate.  Follow-up in 2 weeks for blood pressure re-check.   Olene Floss, MD [place], PGY-2

## 2017-02-03 NOTE — Assessment & Plan Note (Addendum)
-   Chronic. Suspect OA. - Recommended weight loss and refilled patient's PO diclofenac to take prn

## 2017-02-03 NOTE — Assessment & Plan Note (Signed)
-   Noted also with patient's last bimanual exam in 2016. Normal pap at that time. Patient denies heavy periods or abdominal cramping. No change in weight. Suspect fibroids but cannot r/o mass. - Ordered pelvic and transvaginal US to further evaluate.

## 2017-02-04 LAB — COMPREHENSIVE METABOLIC PANEL
ALT: 25 IU/L (ref 0–32)
AST: 24 IU/L (ref 0–40)
Albumin/Globulin Ratio: 1.3 (ref 1.2–2.2)
Albumin: 4.5 g/dL (ref 3.5–5.5)
Alkaline Phosphatase: 68 IU/L (ref 39–117)
BUN/Creatinine Ratio: 16 (ref 9–23)
BUN: 15 mg/dL (ref 6–24)
Bilirubin Total: 0.2 mg/dL (ref 0.0–1.2)
CO2: 23 mmol/L (ref 18–29)
Calcium: 9.7 mg/dL (ref 8.7–10.2)
Chloride: 100 mmol/L (ref 96–106)
Creatinine, Ser: 0.92 mg/dL (ref 0.57–1.00)
GFR calc Af Amer: 86 mL/min/{1.73_m2} (ref >59)
GFR calc non Af Amer: 75 mL/min/{1.73_m2} (ref >59)
Globulin, Total: 3.5 g/dL (ref 1.5–4.5)
Glucose: 85 mg/dL (ref 65–99)
Potassium: 4.1 mmol/L (ref 3.5–5.2)
Sodium: 140 mmol/L (ref 134–144)
Total Protein: 8 g/dL (ref 6.0–8.5)

## 2017-02-06 ENCOUNTER — Ambulatory Visit (HOSPITAL_COMMUNITY): Payer: PRIVATE HEALTH INSURANCE

## 2017-02-06 ENCOUNTER — Encounter: Payer: Self-pay | Admitting: Internal Medicine

## 2017-02-17 ENCOUNTER — Encounter: Payer: Self-pay | Admitting: Internal Medicine

## 2017-02-17 ENCOUNTER — Ambulatory Visit (INDEPENDENT_AMBULATORY_CARE_PROVIDER_SITE_OTHER): Payer: PRIVATE HEALTH INSURANCE | Admitting: Internal Medicine

## 2017-02-17 VITALS — BP 164/94 | HR 94 | Temp 98.5°F | Ht 61.0 in | Wt 190.0 lb

## 2017-02-17 DIAGNOSIS — I10 Essential (primary) hypertension: Secondary | ICD-10-CM | POA: Diagnosis not present

## 2017-02-17 MED ORDER — LOSARTAN POTASSIUM 50 MG PO TABS: 50.0000 mg | ORAL_TABLET | Freq: Every day | ORAL | 0 refills | Status: DC

## 2017-02-17 NOTE — Patient Instructions (Signed)
Pamela Schneider,  I have added losartan 50 mg to your medication regimen. If blood pressure is still above 140/90, increase dose to 100 mg daily.   Please follow-up in 2 weeks to see how your blood pressure is controlled and to check labs. We can also switch you to a combination medication if all is going well.  Best, Dr. Ola Spurr

## 2017-02-17 NOTE — Assessment & Plan Note (Signed)
-   Not at goal of BP less than 140/90, though improved at 164/94 today compared to 178/84 at last OV. - Will add losartan 50 mg. Asked patient to check her BP twice weekly at work and to increase losartan to 100 mg daily if still not at goal. - Patient to follow-up in 2 weeks for repeat labs (CMP -- as also started on statin recently) and recheck BP - If well controlled on regimen of hctz, nifedipine, and losartan, will switch to combination pill of hctz and losartan

## 2017-02-17 NOTE — Progress Notes (Signed)
[name] Progress Note  Subjective:  Pamela Schneider is a 46 y.o. female with history of hypertension and obesity who presents for follow-up of high blood pressure.  #Hypertension: - Denies trouble taking HCTZ 25 mg and nifedipine 30 mg daily. HCTZ was restarted at last office visit 02/03/17. - Says she did not have recurrence of muscle cramping that she thinks she experienced with HCTZ in the past.  - Has not had recent headaches - Continues to walk daily and adjusting her diet to have fewer ready-made meals - With last PCPs had been also taking losartan up to 100 mg daily, as had cramping with lisinopril, but had not been taking this for likely over a year at last office visit. Medical record notes cramping as reason for discontinuing amlodipine, as well. - Has not been checking her blood pressure but says she could do this at work  ROS: No vision changes, no dizziness  Social: Never smoker  No Known Allergies  Objective: Blood pressure (!) 164/94, pulse 94, temperature 98.5 F (36.9 C), temperature source Oral, height 5\' 1"  (1.549 m), weight 190 lb (86.2 kg), last menstrual period 01/28/2017. Body mass index is 35.9 kg/m. BP still elevated upon recheck.  Constitutional:  Pleasant, well-appearing, obese female Cardiovascular: RRR, S1, S2, no m/r/g.  Pulmonary/Chest: Effort normal and breath sounds normal. No respiratory distress.  Musculoskeletal: No lower extremity edema  Skin: Skin is warm and dry. No rash noted.  Psychiatric: Normal mood and affect.  Vitals reviewed  Assessment/Plan: HYPERTENSION, BENIGN SYSTEMIC - Not at goal of BP less than 140/90, though improved at 164/94 today compared to 178/84 at last OV. - Will add losartan 50 mg. Asked patient to check her BP twice weekly at work and to increase losartan to 100 mg daily if still not at goal. - Patient to follow-up in 2 weeks for repeat labs (CMP -- as also started on statin recently) and recheck  BP - If well controlled on regimen of hctz, nifedipine, and losartan, will switch to combination pill of hctz and losartan  Follow-up in 2 weeks for BP recheck.  Olene Floss, MD [place], PGY-2

## 2017-02-23 ENCOUNTER — Ambulatory Visit (HOSPITAL_COMMUNITY): Payer: PRIVATE HEALTH INSURANCE

## 2017-03-02 ENCOUNTER — Other Ambulatory Visit: Payer: Self-pay | Admitting: *Deleted

## 2017-03-02 DIAGNOSIS — K219 Gastro-esophageal reflux disease without esophagitis: Secondary | ICD-10-CM

## 2017-03-02 MED ORDER — OMEPRAZOLE 20 MG PO CPDR: 20.0000 mg | DELAYED_RELEASE_CAPSULE | Freq: Every day | ORAL | 11 refills | Status: DC

## 2017-03-09 ENCOUNTER — Ambulatory Visit (HOSPITAL_COMMUNITY)
Admission: RE | Admit: 2017-03-09 | Discharge: 2017-03-09 | Disposition: A | Discharge status: Home / Self Care | Payer: PRIVATE HEALTH INSURANCE | Source: Ambulatory Visit | Attending: Family Medicine | Admitting: Family Medicine

## 2017-03-09 DIAGNOSIS — N852 Hypertrophy of uterus: Secondary | ICD-10-CM | POA: Diagnosis present

## 2017-03-09 DIAGNOSIS — D259 Leiomyoma of uterus, unspecified: Secondary | ICD-10-CM | POA: Diagnosis not present

## 2017-03-11 ENCOUNTER — Telehealth: Payer: Self-pay | Admitting: Internal Medicine

## 2017-03-11 NOTE — Telephone Encounter (Signed)
Pt is returning a call about her ultrasound she had on Monday.

## 2017-03-12 ENCOUNTER — Encounter: Payer: Self-pay | Admitting: Internal Medicine

## 2017-03-12 NOTE — Telephone Encounter (Signed)
Called 03/11/17 and discussed results. Will send her a letter to bring to her OB/Gyn.

## 2017-03-20 ENCOUNTER — Other Ambulatory Visit: Payer: Self-pay | Admitting: Internal Medicine

## 2017-03-20 DIAGNOSIS — I10 Essential (primary) hypertension: Secondary | ICD-10-CM

## 2017-04-06 ENCOUNTER — Encounter: Payer: Self-pay | Admitting: Internal Medicine

## 2017-04-06 ENCOUNTER — Ambulatory Visit (INDEPENDENT_AMBULATORY_CARE_PROVIDER_SITE_OTHER): Payer: PRIVATE HEALTH INSURANCE | Admitting: Internal Medicine

## 2017-04-06 VITALS — BP 152/98 | HR 87 | Temp 98.1°F | Ht 61.0 in | Wt 190.0 lb

## 2017-04-06 DIAGNOSIS — I10 Essential (primary) hypertension: Secondary | ICD-10-CM | POA: Diagnosis not present

## 2017-04-06 MED ORDER — LOSARTAN POTASSIUM 50 MG PO TABS: 100.0000 mg | ORAL_TABLET | Freq: Every day | ORAL | 0 refills | Status: DC

## 2017-04-06 NOTE — Progress Notes (Signed)
[facility]  Subjective:  Pamela Schneider is a 46 y.o. female who presents to follow-up HTN.  #HTN: - Has been on multiple agents in the past without control and has discontinued agents for increased fatigue, problems with muscle cramping and leg swelling. - Last recommended restarting losartan and HCTZ along with nifedipine. Is taking 25 mg of HCTZ and lorsartan 100 mg. Increased losartan from 50 mg about a month ago and has had a dry cough, has been feeling more tired and having body aches. She also continues to have LE swelling--says this is worse after working for 8 hours. - Has been taking nifedipine 30 mg daily since 2016. Had been on HCTZ and losartan, as well, with the nifedipine but did not regularly take these.  - Had tried amlodipine in past but had side effect of fatigue - Other medications tried upon chart review include chlorthalidone and lisinopril.  - Reports strong family history of HTN but mom was able to have control on 1 agent and come off of treatment after treatment for cancer. Denies family history of kidney disorders. - Denies missing doses of her BP medications ROS: No dyspnea, no fevers, no swelling of tongue or lips, no HAs  Social: Never smoker  Objective: Blood pressure (!) 152/98, pulse 87, temperature 98.1 F (36.7 C), temperature source Oral, height 5\' 1"  (1.549 m), weight 190 lb (86.2 kg), last menstrual period 03/22/2017, SpO2 98 %. Body mass index is 35.9 kg/m. Constitutional: Obese female, very pleasant in NAD HENT: MMM Cardiovascular: RRR, S1, S2, no m/r/g. No abdominal bruit appreciated.  Pulmonary/Chest: Effort normal and breath sounds normal. No respiratory distress.  Abdominal: Soft. +BS Musculoskeletal: No LE edema Psychiatric: Normal mood and affect.  Vitals reviewed  Recent Results (from the past 2160 hour(s))  Comprehensive metabolic panel     Status: None   Collection Time: 02/03/17  2:42 PM  Result  Value Ref Range   Glucose 85 65 - 99 mg/dL   BUN 15 6 - 24 mg/dL   Creatinine, Ser 0.92 0.57 - 1.00 mg/dL   GFR calc non Af Amer 75 >59 mL/min/1.73   GFR calc Af Amer 86 >59 mL/min/1.73   BUN/Creatinine Ratio 16 9 - 23   Sodium 140 134 - 144 mmol/L   Potassium 4.1 3.5 - 5.2 mmol/L   Chloride 100 96 - 106 mmol/L   CO2 23 18 - 29 mmol/L   Calcium 9.7 8.7 - 10.2 mg/dL   Total Protein 8.0 6.0 - 8.5 g/dL   Albumin 4.5 3.5 - 5.5 g/dL   Globulin, Total 3.5 1.5 - 4.5 g/dL   Albumin/Globulin Ratio 1.3 1.2 - 2.2   Bilirubin Total 0.2 0.0 - 1.2 mg/dL   Alkaline Phosphatase 68 39 - 117 IU/L   AST 24 0 - 40 IU/L   ALT 25 0 - 32 IU/L   Vitals - 1 value per visit 04/06/2017 02/17/2017 02/03/2017 06/08/7618 5/0/9326  SYSTOLIC 712 458 [phone]  DIASTOLIC 98 94 84 89 053   Vitals - 1 value per visit 02/22/2014 09/28/2013 09/08/2013 97/67/3419  SYSTOLIC 379 024 097 353  DIASTOLIC 98 299 90 89   Vitals - 1 value per visit 02/24/2013 2/42/6834  SYSTOLIC 196 222  DIASTOLIC 96 98    Assessment/Plan: HYPERTENSION, BENIGN SYSTEMIC - Not at goal of < 140/90 on treatment with 3 agents - Recommended discontinuing nifedipine given consistent complaint of leg swelling, despite this being longer term medication.  -  If still having fatigue and leg cramping after a few days, would decrease dose of losartan back to 50 mg - Continue HCTZ 25 mg - Recommended patient have ambulatory blood pressure monitoring and discussion about treatment with Dr. Valentina Lucks  - Recheck BMP if still on losartan at follow-up  Follow-up with Dr. Valentina Lucks for ambulatory blood pressure monitoring and back with me once completed.  Olene Floss, MD [place], PGY-2

## 2017-04-06 NOTE — Patient Instructions (Signed)
Pamela Schneider,  For your blood pressure, continue the hydrochlorothiazide 25 mg and losartan 100 mg daily (2 tablets). Decrease losartan to 50 mg if still having cough. Stop nifedipine because it can cause leg swelling. Norvasc may be the next thing we have you try.  Please make an appointment with Dr. Valentina Lucks for ambulatory blood pressure monitoring at your earliest convenience.  Best, Dr. Ola Spurr

## 2017-04-08 NOTE — Assessment & Plan Note (Signed)
-   Not at goal of < 140/90 on treatment with 3 agents - Recommended discontinuing nifedipine given consistent complaint of leg swelling, despite this being longer term medication.  - If still having fatigue and leg cramping after a few days, would decrease dose of losartan back to 50 mg - Continue HCTZ 25 mg - Recommended patient have ambulatory blood pressure monitoring and discussion about treatment with Dr. Valentina Lucks  - Recheck BMP if still on losartan at follow-up

## 2017-04-10 ENCOUNTER — Other Ambulatory Visit: Payer: Self-pay | Admitting: Internal Medicine

## 2017-04-10 DIAGNOSIS — I10 Essential (primary) hypertension: Secondary | ICD-10-CM

## 2017-04-14 ENCOUNTER — Ambulatory Visit: Payer: PRIVATE HEALTH INSURANCE | Admitting: Pharmacist

## 2017-06-23 ENCOUNTER — Other Ambulatory Visit: Payer: Self-pay | Admitting: Internal Medicine

## 2017-06-23 DIAGNOSIS — I10 Essential (primary) hypertension: Secondary | ICD-10-CM

## 2017-09-07 ENCOUNTER — Other Ambulatory Visit: Payer: Self-pay | Admitting: Family Medicine

## 2017-09-07 ENCOUNTER — Other Ambulatory Visit: Payer: Self-pay | Admitting: Internal Medicine

## 2017-09-07 DIAGNOSIS — I10 Essential (primary) hypertension: Secondary | ICD-10-CM

## 2017-09-07 DIAGNOSIS — G8929 Other chronic pain: Secondary | ICD-10-CM

## 2017-09-07 DIAGNOSIS — M25562 Pain in left knee: Secondary | ICD-10-CM

## 2017-09-07 DIAGNOSIS — Z1231 Encounter for screening mammogram for malignant neoplasm of breast: Secondary | ICD-10-CM

## 2017-09-28 ENCOUNTER — Ambulatory Visit
Admission: RE | Admit: 2017-09-28 | Discharge: 2017-09-28 | Disposition: A | Discharge status: Home / Self Care | Payer: PRIVATE HEALTH INSURANCE | Source: Ambulatory Visit | Attending: Family Medicine | Admitting: Family Medicine

## 2017-09-28 DIAGNOSIS — Z1231 Encounter for screening mammogram for malignant neoplasm of breast: Secondary | ICD-10-CM

## 2017-11-28 ENCOUNTER — Other Ambulatory Visit: Payer: Self-pay | Admitting: Internal Medicine

## 2017-11-28 DIAGNOSIS — I10 Essential (primary) hypertension: Secondary | ICD-10-CM

## 2018-02-26 ENCOUNTER — Other Ambulatory Visit: Payer: Self-pay | Admitting: *Deleted

## 2018-02-26 DIAGNOSIS — G8929 Other chronic pain: Secondary | ICD-10-CM

## 2018-02-26 DIAGNOSIS — M25562 Pain in left knee: Principal | ICD-10-CM

## 2018-02-26 DIAGNOSIS — I10 Essential (primary) hypertension: Secondary | ICD-10-CM

## 2018-02-26 MED ORDER — DICLOFENAC SODIUM 75 MG PO TBEC: DELAYED_RELEASE_TABLET | ORAL | 0 refills | Status: DC

## 2018-02-26 MED ORDER — HYDROCHLOROTHIAZIDE 25 MG PO TABS: 25.0000 mg | ORAL_TABLET | Freq: Every day | ORAL | 1 refills | Status: DC

## 2018-02-26 MED ORDER — LOSARTAN POTASSIUM 50 MG PO TABS: 100.0000 mg | ORAL_TABLET | Freq: Every day | ORAL | 1 refills | Status: DC

## 2018-03-09 ENCOUNTER — Ambulatory Visit (INDEPENDENT_AMBULATORY_CARE_PROVIDER_SITE_OTHER): Payer: PRIVATE HEALTH INSURANCE | Admitting: Internal Medicine

## 2018-03-09 ENCOUNTER — Encounter: Payer: Self-pay | Admitting: Internal Medicine

## 2018-03-09 VITALS — BP 150/100 | HR 83 | Temp 99.1°F | Ht 61.0 in | Wt 187.6 lb

## 2018-03-09 DIAGNOSIS — M25561 Pain in right knee: Secondary | ICD-10-CM | POA: Diagnosis not present

## 2018-03-09 DIAGNOSIS — G8929 Other chronic pain: Secondary | ICD-10-CM | POA: Diagnosis not present

## 2018-03-09 DIAGNOSIS — M25562 Pain in left knee: Secondary | ICD-10-CM | POA: Diagnosis not present

## 2018-03-09 DIAGNOSIS — Z6835 Body mass index (BMI) 35.0-35.9, adult: Secondary | ICD-10-CM | POA: Diagnosis not present

## 2018-03-09 DIAGNOSIS — I10 Essential (primary) hypertension: Secondary | ICD-10-CM

## 2018-03-09 DIAGNOSIS — Z862 Personal history of diseases of the blood and blood-forming organs and certain disorders involving the immune mechanism: Secondary | ICD-10-CM

## 2018-03-09 DIAGNOSIS — Z0000 Encounter for general adult medical examination without abnormal findings: Secondary | ICD-10-CM | POA: Diagnosis not present

## 2018-03-09 DIAGNOSIS — Z5181 Encounter for therapeutic drug level monitoring: Secondary | ICD-10-CM

## 2018-03-09 DIAGNOSIS — K219 Gastro-esophageal reflux disease without esophagitis: Secondary | ICD-10-CM

## 2018-03-09 MED ORDER — FLUTICASONE PROPIONATE 50 MCG/ACT NA SUSP: 1.0000 | Freq: Every day | NASAL | 5 refills | Status: DC

## 2018-03-09 MED ORDER — HYDROCHLOROTHIAZIDE 25 MG PO TABS: 25.0000 mg | ORAL_TABLET | Freq: Every day | ORAL | 1 refills | Status: DC

## 2018-03-09 MED ORDER — OMEPRAZOLE 20 MG PO CPDR: 20.0000 mg | DELAYED_RELEASE_CAPSULE | Freq: Every day | ORAL | 11 refills | Status: DC

## 2018-03-09 MED ORDER — LOSARTAN POTASSIUM 50 MG PO TABS: 50.0000 mg | ORAL_TABLET | Freq: Every day | ORAL | 1 refills | Status: DC

## 2018-03-09 NOTE — Patient Instructions (Signed)
Pamela Schneider,  Please check your blood pressure a few times over the next week at work. Your goal is less than 140/90. If your readings are above this consistently, please call for appointment with Dr. Valentina Lucks for ambulatory blood pressure monitoring.  I will call with your lab results.  I love your goal of walking Tuesday and Thursday. Keeping your leg muscles strong helps protect your knees.  Best, Dr. Ola Spurr

## 2018-03-09 NOTE — Progress Notes (Signed)
[name] Progress Note  Subjective:  Pamela Schneider is a 47 y.o. female with history of HTN, GERD, uterine fibroids, and chronic knee pain who presents for check-up.   Concerns:  - Has had urge to chew ice over last few months. Periods have been heavier over the first couple of days (tampon will last a few hours only) of menstrual cycle but not drastically so. This has been a change since last year.  - Knees hurt from time to time but seem better when more active.  - Occasional constipation. Improved with drinking more fluids and eating more vegetables.   HTN: - Has been out of medication for about 1 week. She has been taking losartan (settled on 50 mg dose) and hctz 25 mg daily. Higher dose of losartan seemed to cause leg cramping. Nifedipine seemed to cause fatigue so this was stopped previously. - Has been trying to improve diet by meal-prepping lunch. She has been having oatmeal for breakfast. Dinner sometimes is healthy, sometimes is not because often decided on the fly.   Social: - Does not regularly exercise - Never smoker - Drinks EtOH 2-3 times a week, typically a glass of wine, never more than 6 drinks on occasion - Does not use illegal drugs - Feels safe in her relationship  HM: Normal pap without HPV on 10/2015. No family history of breast, ovarian, or colon cancer to suggest early screening.   No Known Allergies  Social History   Tobacco Use  . Smoking status: Never Smoker  . Smokeless tobacco: Never Used  Substance Use Topics  . Alcohol use: Yes    Objective: Blood pressure (!) 150/100, pulse 83, temperature 99.1 F (37.3 C), temperature source Oral, height 5\' 1"  (1.549 m), weight 187 lb 9.6 oz (85.1 kg), last menstrual period 03/08/2018, SpO2 100 %. Body mass index is 35.45 kg/m. Has not taken blood pressure medication today.  Constitutional: Very pleasant, obese female in NAD HENT: MMM, PERRL Cardiovascular: RRR, S1, S2, no m/r/g.   Pulmonary/Chest: Effort normal and breath sounds normal.  Abdominal: Soft. +BS, NT Musculoskeletal: No LE edema. Bilateral crepitus of knees. No obvious effusions. Neurological: AOx3, no focal deficits. Skin: Skin is warm and dry. No rash noted.  Psychiatric: Normal mood and affect.  Vitals reviewed  PHQ2: 0  Assessment/Plan: HYPERTENSION, BENIGN SYSTEMIC - Above goal of 140/90. However, patient ran out of medication. - Resume losartan 50 mg and hctz 25 mg daily. Patient to check BP at work and will call if readings above goal. Would recommend ambulatory blood pressure monitoring with Dr. Valentina Lucks if BPs elevated. - Will check CMP - Congratulated patient on trying to make dietary changes. Suggested meal-prepping dinner, as well.   OBESITY, UNSPECIFIED - Will check lipid panel. Patient working on dietary changes. Set goal of increasing physical activity with walking Tuesdays and Thursdays.  History of anemia - Experiencing pica. Will check CBC. If low hgb and low MCV, will suggest iron supplementation and follow-up with OB for discussion of treatment options (unsure if IUD would be treatment option for menorrhagia associated with fibroids given their size)  Follow-up prn or sooner if anemic (~4 wks after starting iron supplementation).  Olene Floss, MD [place], PGY-3

## 2018-03-10 ENCOUNTER — Other Ambulatory Visit: Payer: Self-pay | Admitting: Internal Medicine

## 2018-03-10 LAB — COMPREHENSIVE METABOLIC PANEL
A/G RATIO: 1.2 (ref 1.2–2.2)
ALK PHOS: 51 IU/L (ref 39–117)
ALT: 20 IU/L (ref 0–32)
AST: 21 IU/L (ref 0–40)
Albumin: 4.2 g/dL (ref 3.5–5.5)
BILIRUBIN TOTAL: 0.2 mg/dL (ref 0.0–1.2)
BUN/Creatinine Ratio: 16 (ref 9–23)
BUN: 14 mg/dL (ref 6–24)
CO2: 23 mmol/L (ref 20–29)
CREATININE: 0.87 mg/dL (ref 0.57–1.00)
Calcium: 9.1 mg/dL (ref 8.7–10.2)
Chloride: 104 mmol/L (ref 96–106)
GFR calc non Af Amer: 80 mL/min/{1.73_m2} (ref >59)
GFR, EST AFRICAN AMERICAN: 92 mL/min/{1.73_m2} (ref >59)
Globulin, Total: 3.5 g/dL (ref 1.5–4.5)
Glucose: 76 mg/dL (ref 65–99)
Potassium: 3.9 mmol/L (ref 3.5–5.2)
Sodium: 139 mmol/L (ref 134–144)
TOTAL PROTEIN: 7.7 g/dL (ref 6.0–8.5)

## 2018-03-10 LAB — LIPID PANEL
CHOLESTEROL TOTAL: 142 mg/dL (ref 100–199)
Chol/HDL Ratio: 2 ratio (ref 0.0–4.4)
HDL: 70 mg/dL (ref >39)
LDL CALC: 54 mg/dL (ref 0–99)
Triglycerides: 88 mg/dL (ref 0–149)
VLDL CHOLESTEROL CAL: 18 mg/dL (ref 5–40)

## 2018-03-10 LAB — CBC
HEMATOCRIT: 30.5 % — AB (ref 34.0–46.6)
Hemoglobin: 9.1 g/dL — ABNORMAL LOW (ref 11.1–15.9)
MCH: 18.1 pg — ABNORMAL LOW (ref 26.6–33.0)
MCHC: 29.8 g/dL — ABNORMAL LOW (ref 31.5–35.7)
MCV: 61 fL — AB (ref 79–97)
Platelets: 249 10*3/uL (ref 150–379)
RBC: 5.02 x10E6/uL (ref 3.77–5.28)
RDW: 20.6 % — AB (ref 12.3–15.4)
WBC: 4.7 10*3/uL (ref 3.4–10.8)

## 2018-03-10 MED ORDER — FERROUS SULFATE 324 (65 FE) MG PO TBEC: 1.0000 | DELAYED_RELEASE_TABLET | Freq: Every day | ORAL | 5 refills | Status: DC

## 2018-03-11 ENCOUNTER — Other Ambulatory Visit: Payer: Self-pay

## 2018-03-11 DIAGNOSIS — M25562 Pain in left knee: Secondary | ICD-10-CM

## 2018-03-11 DIAGNOSIS — G8929 Other chronic pain: Secondary | ICD-10-CM

## 2018-03-11 DIAGNOSIS — I10 Essential (primary) hypertension: Secondary | ICD-10-CM

## 2018-03-11 MED ORDER — DICLOFENAC SODIUM 75 MG PO TBEC: DELAYED_RELEASE_TABLET | ORAL | 0 refills | Status: DC

## 2018-03-11 MED ORDER — LOSARTAN POTASSIUM 50 MG PO TABS: 50.0000 mg | ORAL_TABLET | Freq: Every day | ORAL | 1 refills | Status: DC

## 2018-03-12 ENCOUNTER — Encounter: Payer: Self-pay | Admitting: Internal Medicine

## 2018-03-12 DIAGNOSIS — Z862 Personal history of diseases of the blood and blood-forming organs and certain disorders involving the immune mechanism: Secondary | ICD-10-CM | POA: Insufficient documentation

## 2018-03-12 HISTORY — DX: Personal history of diseases of the blood and blood-forming organs and certain disorders involving the immune mechanism: Z86.2

## 2018-03-12 NOTE — Assessment & Plan Note (Addendum)
-   Experiencing pica. Will check CBC. If low hgb and low MCV, will suggest iron supplementation and follow-up with OB for discussion of treatment options (unsure if IUD would be treatment option for menorrhagia associated with fibroids given their size)

## 2018-03-12 NOTE — Assessment & Plan Note (Signed)
-   Above goal of 140/90. However, patient ran out of medication. - Resume losartan 50 mg and hctz 25 mg daily. Patient to check BP at work and will call if readings above goal. Would recommend ambulatory blood pressure monitoring with Dr. Valentina Lucks if BPs elevated. - Will check CMP - Congratulated patient on trying to make dietary changes. Suggested meal-prepping dinner, as well.

## 2018-03-12 NOTE — Assessment & Plan Note (Signed)
-   Will check lipid panel. Patient working on dietary changes. Set goal of increasing physical activity with walking Tuesdays and Thursdays.

## 2018-03-16 ENCOUNTER — Other Ambulatory Visit: Payer: Self-pay | Admitting: Internal Medicine

## 2018-03-16 DIAGNOSIS — G8929 Other chronic pain: Secondary | ICD-10-CM

## 2018-03-16 DIAGNOSIS — M25562 Pain in left knee: Principal | ICD-10-CM

## 2018-07-13 ENCOUNTER — Encounter (HOSPITAL_COMMUNITY): Payer: Self-pay | Admitting: Emergency Medicine

## 2018-07-13 ENCOUNTER — Telehealth: Payer: Self-pay

## 2018-07-13 ENCOUNTER — Emergency Department (HOSPITAL_COMMUNITY)
Admission: EM | Admit: 2018-07-13 | Discharge: 2018-07-13 | Disposition: A | Discharge status: Home / Self Care | Payer: PRIVATE HEALTH INSURANCE | Attending: Emergency Medicine | Admitting: Emergency Medicine

## 2018-07-13 DIAGNOSIS — I10 Essential (primary) hypertension: Secondary | ICD-10-CM | POA: Insufficient documentation

## 2018-07-13 DIAGNOSIS — Z79899 Other long term (current) drug therapy: Secondary | ICD-10-CM | POA: Insufficient documentation

## 2018-07-13 LAB — BASIC METABOLIC PANEL
Anion gap: 8 (ref 5–15)
BUN: 17 mg/dL (ref 6–20)
CO2: 25 mmol/L (ref 22–32)
CREATININE: 0.9 mg/dL (ref 0.44–1.00)
Calcium: 9.5 mg/dL (ref 8.9–10.3)
Chloride: 107 mmol/L (ref 98–111)
GFR calc Af Amer: >60 mL/min (ref >60)
GFR calc non Af Amer: >60 mL/min (ref >60)
Glucose, Bld: 132 mg/dL — ABNORMAL HIGH (ref 70–99)
Potassium: 3.6 mmol/L (ref 3.5–5.1)
Sodium: 140 mmol/L (ref 135–145)

## 2018-07-13 LAB — CBC WITH DIFFERENTIAL/PLATELET
BASOS PCT: 0 %
Basophils Absolute: 0 10*3/uL (ref 0.0–0.1)
Eosinophils Absolute: 0.1 10*3/uL (ref 0.0–0.7)
Eosinophils Relative: 2 %
HEMATOCRIT: 33.8 % — AB (ref 36.0–46.0)
Hemoglobin: 10.6 g/dL — ABNORMAL LOW (ref 12.0–15.0)
LYMPHS PCT: 32 %
Lymphs Abs: 1.3 10*3/uL (ref 0.7–4.0)
MCH: 20.3 pg — AB (ref 26.0–34.0)
MCHC: 31.4 g/dL (ref 30.0–36.0)
MCV: 64.6 fL — AB (ref 78.0–100.0)
MONOS PCT: 3 %
Monocytes Absolute: 0.1 10*3/uL (ref 0.1–1.0)
Neutro Abs: 2.7 10*3/uL (ref 1.7–7.7)
Neutrophils Relative %: 63 %
Platelets: 199 10*3/uL (ref 150–400)
RBC: 5.23 MIL/uL — ABNORMAL HIGH (ref 3.87–5.11)
RDW: 18.9 % — ABNORMAL HIGH (ref 11.5–15.5)
WBC: 4.2 10*3/uL (ref 4.0–10.5)

## 2018-07-13 NOTE — Telephone Encounter (Signed)
Brayton Layman- NP [facility] patient, states she was seen in her clinic and BP was 200/100 and did not respond to .2mg  of clonidine. Advised pt to be evaluated in ED.  Call back with any concerns [phone] Wallace Cullens, RN

## 2018-07-13 NOTE — ED Provider Notes (Signed)
[facility] DEPT Provider Note   CSN: [id number] Arrival date & time: 07/13/18  1538     History   Chief Complaint Chief Complaint  Patient presents with  . Hypertension    HPI Pamela Schneider is a 47 y.o. female.  Patient was sent to the emergency department by her work because her blood pressure was elevated she states that she feels fine.  The history is provided by the patient. No language interpreter was used.  Illness  This is a chronic problem. The current episode started more than 1 week ago. The problem occurs constantly. The problem has not changed since onset.Pertinent negatives include no chest pain, no abdominal pain and no headaches. Nothing aggravates the symptoms. Nothing relieves the symptoms. She has tried nothing for the symptoms. The treatment provided no relief.    Past Medical History:  Diagnosis Date  . Hypertension     Patient Active Problem List   Diagnosis Date Noted  . History of anemia 03/12/2018  . Enlarged uterus 02/03/2017  . Knee pain, bilateral 10/09/2015  . Allergic rhinitis 02/24/2013  . Well woman exam 08/20/2011  . CHEST PAIN, ATYPICAL 08/08/2010  . OBESITY, UNSPECIFIED 02/16/2008  . HYPERTENSION, BENIGN SYSTEMIC 12/31/2006  . GASTROESOPHAGEAL REFLUX, NO ESOPHAGITIS 12/31/2006    Past Surgical History:  Procedure Laterality Date  . TUBAL LIGATION       OB History   None      Home Medications    Prior to Admission medications   Medication Sig Start Date End Date Taking? Authorizing Provider  ferrous sulfate 324 (65 Fe) MG TBEC Take 1 tablet (325 mg total) by mouth daily. 03/10/18  Yes Rogue Bussing, MD  hydrochlorothiazide (HYDRODIURIL) 25 MG tablet Take 1 tablet (25 mg total) by mouth daily. 03/09/18  Yes Rogue Bussing, MD  losartan (COZAAR) 50 MG tablet Take 1 tablet (50 mg total) by mouth daily. 03/11/18  Yes Rogue Bussing, MD  Multiple Vitamin  (MULTIVITAMIN WITH MINERALS) TABS tablet Take 1 tablet by mouth daily.   Yes [provider]  omeprazole (PRILOSEC) 20 MG capsule Take 1 capsule (20 mg total) by mouth daily. 03/09/18  Yes Rogue Bussing, MD  atorvastatin (LIPITOR) 20 MG tablet Take 1 tablet (20 mg total) by mouth daily. Patient not taking: Reported on 07/13/2018 02/03/17   Rogue Bussing, MD  diclofenac (VOLTAREN) 75 MG EC tablet TAKE 1 TABLET BY MOUTH TWICE DAILY AS NEEDED FOR KNEE PAIN. Do not use daily. Patient not taking: Reported on 07/13/2018 03/11/18   Rogue Bussing, MD  diclofenac (VOLTAREN) 75 MG EC tablet TAKE 1 TABLET BY MOUTH TWICE DAILY AS NEEDED FOR KNEE PAIN Patient not taking: Reported on 07/13/2018 03/17/18   Rogue Bussing, MD  fluticasone [facility]) 50 MCG/ACT nasal spray Place 1 spray into both nostrils daily. 1 spray in each nostril every day Patient not taking: Reported on 07/13/2018 03/09/18   Rogue Bussing, MD    Family History Family History  Problem Relation Age of Onset  . Hypertension Mother   . Breast cancer Mother   . Hypertension Father     Social History Social History   Tobacco Use  . Smoking status: Never Smoker  . Smokeless tobacco: Never Used  Substance Use Topics  . Alcohol use: Yes    Alcohol/week: 7.0 standard drinks    Types: 7 Glasses of wine per week  . Drug use: Not on file  Allergies   Patient has no known allergies.   Review of Systems Review of Systems  Constitutional: Negative for appetite change and fatigue.  HENT: Negative for congestion, ear discharge and sinus pressure.   Eyes: Negative for discharge.  Respiratory: Negative for cough.   Cardiovascular: Negative for chest pain.  Gastrointestinal: Negative for abdominal pain and diarrhea.  Genitourinary: Negative for frequency and hematuria.  Musculoskeletal: Negative for back pain.  Skin: Negative for rash.  Neurological: Negative for seizures and  headaches.  Psychiatric/Behavioral: Negative for hallucinations.     Physical Exam Updated Vital Signs BP (!) 175/99   Pulse 88   Temp 98.9 F (37.2 C) (Oral)   Resp 20   LMP 06/27/2018   SpO2 100%   Physical Exam  Constitutional: She is oriented to person, place, and time. She appears well-developed.  HENT:  Head: Normocephalic.  Eyes: Conjunctivae and EOM are normal. No scleral icterus.  Neck: Neck supple. No thyromegaly present.  Cardiovascular: Normal rate and regular rhythm. Exam reveals no gallop and no friction rub.  No murmur heard. Pulmonary/Chest: No stridor. She has no wheezes. She has no rales. She exhibits no tenderness.  Abdominal: She exhibits no distension. There is no tenderness. There is no rebound.  Musculoskeletal: Normal range of motion. She exhibits no edema.  Lymphadenopathy:    She has no cervical adenopathy.  Neurological: She is oriented to person, place, and time. She exhibits normal muscle tone. Coordination normal.  Skin: No rash noted. No erythema.  Psychiatric: She has a normal mood and affect. Her behavior is normal.     ED Treatments / Results  Labs (all labs ordered are listed, but only abnormal results are displayed) Labs Reviewed  CBC WITH DIFFERENTIAL/PLATELET - Abnormal; Notable for the following components:      Result Value   RBC 5.23 (*)    Hemoglobin 10.6 (*)    HCT 33.8 (*)    MCV 64.6 (*)    MCH 20.3 (*)    RDW 18.9 (*)    All other components within normal limits  BASIC METABOLIC PANEL - Abnormal; Notable for the following components:   Glucose, Bld 132 (*)    All other components within normal limits    EKG EKG Interpretation  Date/Time:  Tuesday July 13 2018 16:35:20 EDT Ventricular Rate:  94 PR Interval:    QRS Duration: 84 QT Interval:  344 QTC Calculation: 431 R Axis:   59 Text Interpretation:  Sinus rhythm Probable anterior infarct, old Nonspecific T abnormalities, lateral leads Confirmed by Milton Ferguson [phone]) on 07/13/2018 7:36:25 PM   Radiology No results found.  Procedures Procedures (including critical care time)  Medications Ordered in ED Medications - No data to display   Initial Impression / Assessment and Plan / ED Course  I have reviewed the triage vital signs and the nursing notes.  Pertinent labs & imaging results that were available during my care of the patient were reviewed by me and considered in my medical decision making (see chart for details).     Labs unremarkable except for mild anemia.  EKG shows nonspecific changes.  Patient's blood pressure has been moderately elevated the emergency department.  She is going to follow-up with her PCP in the next week to discuss her blood pressure and her EKG changes.  She is also given a copy of her lab work  Final Clinical Impressions(s) / ED Diagnoses   Final diagnoses:  Essential hypertension    ED Discharge  Orders    None       Milton Ferguson, MD 07/13/18 1942

## 2018-07-13 NOTE — Discharge Instructions (Addendum)
Follow-up with your family doctor next week.  Tell him that you are in the emergency department any blood pressures been moderately elevated.  Also let him know that he had some minor changes on your EKG

## 2018-07-13 NOTE — ED Triage Notes (Signed)
Pt reports that at work she got new Wellness at her work. Reports that she got her BP checked and was high. Reports takes HTN meds as prescribed. Reports will have headaches but will go away after taking medication for them.

## 2018-07-15 ENCOUNTER — Ambulatory Visit (HOSPITAL_COMMUNITY)
Admission: RE | Admit: 2018-07-15 | Discharge: 2018-07-15 | Disposition: A | Discharge status: Home / Self Care | Payer: PRIVATE HEALTH INSURANCE | Source: Ambulatory Visit | Attending: Family Medicine | Admitting: Family Medicine

## 2018-07-15 ENCOUNTER — Other Ambulatory Visit: Payer: Self-pay

## 2018-07-15 ENCOUNTER — Ambulatory Visit (INDEPENDENT_AMBULATORY_CARE_PROVIDER_SITE_OTHER): Payer: PRIVATE HEALTH INSURANCE | Admitting: Student in an Organized Health Care Education/Training Program

## 2018-07-15 VITALS — BP 230/100 | HR 101 | Temp 98.4°F | Ht 61.0 in | Wt 188.2 lb

## 2018-07-15 DIAGNOSIS — I10 Essential (primary) hypertension: Secondary | ICD-10-CM

## 2018-07-15 MED ORDER — LOSARTAN POTASSIUM 100 MG PO TABS: 50.0000 mg | ORAL_TABLET | Freq: Every day | ORAL | 0 refills | Status: DC

## 2018-07-15 NOTE — Patient Instructions (Signed)
It was a pleasure seeing you today in our clinic.   Please schedule a visit to be seen again in our clinic on Monday or Tuesday.  Our clinic's number is [phone]. Please call with questions or concerns about what we discussed today.  Be well, Dr. Burr Medico

## 2018-07-15 NOTE — Progress Notes (Signed)
Subjective:    Pamela Schneider - 47 y.o. female MRN [id number]  Date of birth: 27-Apr-1971  HPI  Pamela Schneider is here for elevated blood pressure.  Patient has been asymptomatic and in her usual state of health. Howver, two days ago she had a nurse at work check her blood pressure and itw as noted to be 935T systolic. She was not having symptoms at that time. She has not been having chest pain or dyspnea, but she does note that she has had occasional headaches which have resolved with tylenol.. She has not had any blurry vision.    Patient reports that she has Losartan 50 mg daily and HCTZ 25 mg daily. She went to the emergency department two days ago and was advised to see PCP for further HTN med management, prompting her to make this visit for today.   Health Maintenance:  Health Maintenance Due  Topic Date Due  . INFLUENZA VACCINE  06/03/2018    -  reports that she has never smoked. She has never used smokeless tobacco. - Review of Systems: Per HPI. - Past Medical History: Patient Active Problem List   Diagnosis Date Noted  . History of anemia 03/12/2018  . Enlarged uterus 02/03/2017  . Knee pain, bilateral 10/09/2015  . Allergic rhinitis 02/24/2013  . Well woman exam 08/20/2011  . CHEST PAIN, ATYPICAL 08/08/2010  . OBESITY, UNSPECIFIED 02/16/2008  . HYPERTENSION, BENIGN SYSTEMIC 12/31/2006  . GASTROESOPHAGEAL REFLUX, NO ESOPHAGITIS 12/31/2006   - Medications: reviewed and updated Current Outpatient Medications  Medication Sig Dispense Refill  . atorvastatin (LIPITOR) 20 MG tablet Take 1 tablet (20 mg total) by mouth daily. (Patient not taking: Reported on 07/13/2018) 30 tablet 3  . diclofenac (VOLTAREN) 75 MG EC tablet TAKE 1 TABLET BY MOUTH TWICE DAILY AS NEEDED FOR KNEE PAIN. Do not use daily. (Patient not taking: Reported on 07/13/2018) 60 tablet 0  . diclofenac (VOLTAREN) 75 MG EC tablet TAKE 1 TABLET BY MOUTH TWICE DAILY AS NEEDED FOR KNEE PAIN (Patient not  taking: Reported on 07/13/2018) 30 tablet 0  . ferrous sulfate 324 (65 Fe) MG TBEC Take 1 tablet (325 mg total) by mouth daily. 30 tablet 5  . fluticasone (FLONASE) 50 MCG/ACT nasal spray Place 1 spray into both nostrils daily. 1 spray in each nostril every day (Patient not taking: Reported on 07/13/2018) 16 g 5  . hydrochlorothiazide (HYDRODIURIL) 25 MG tablet Take 1 tablet (25 mg total) by mouth daily. 90 tablet 1  . losartan (COZAAR) 50 MG tablet Take 1 tablet (50 mg total) by mouth daily. 90 tablet 1  . Multiple Vitamin (MULTIVITAMIN WITH MINERALS) TABS tablet Take 1 tablet by mouth daily.    Marland Kitchen omeprazole (PRILOSEC) 20 MG capsule Take 1 capsule (20 mg total) by mouth daily. 30 capsule 11   No current facility-administered medications for this visit.     Review of Systems See HPI     Objective:   Physical Exam BP (!) 230/100   Pulse (!) 101   Temp 98.4 F (36.9 C) (Oral)   Ht 5\' 1"  (1.549 m)   Wt 188 lb 3.2 oz (85.4 kg)   LMP 06/27/2018   SpO2 98%   BMI 35.56 kg/m  Gen: NAD, alert, cooperative with exam, well-appearing  HEENT: NCAT, PERRL, clear conjunctiva CV: RRR, good S1/S2, no murmur, no edema, capillary refill brisk  Resp: CTABL, no wheezes, non-labored Abd: SNTND, BS present, no guarding or organomegaly Skin: no rashes, normal turgor  Neuro: no gross deficits.  Psych: good insight, alert and oriented     Assessment & Plan:   1. Essential hypertension Patient appears well in the office today. She does have very elevated blood pressure. We will check an EKG to rule out ischemia. She had recent blood work in the ED which is reassuring that there was not end-organ damage occurring from this blood pressure.  - EKG 12-Lead - increase losartan to 100 mg daily, discussed with patient - continue HCTZ 25 mg daily - Patient to follow up for blood pressure check in 1 week - reasons to return to care were discussed  Everrett Coombe, MD,MS,  PGY3 07/15/2018 3:27 PM

## 2018-07-20 ENCOUNTER — Ambulatory Visit: Payer: PRIVATE HEALTH INSURANCE

## 2018-07-20 DIAGNOSIS — I10 Essential (primary) hypertension: Secondary | ICD-10-CM

## 2018-07-20 NOTE — Progress Notes (Signed)
Pt presents in nurse clinic for BP check. BP today 178/88. Checked BP in right arm with a large cuff. Pt denies any present symptoms. Patient last took her Lorsartan 100mg  @ 7am this morning. Pt did not take her HCTZ this morning, however. Pt instructed to take all of her BP meds. Will route note to PCP.

## 2018-07-21 MED ORDER — LOSARTAN POTASSIUM 100 MG PO TABS: 100.0000 mg | ORAL_TABLET | Freq: Every day | ORAL | 0 refills | Status: DC

## 2018-08-12 ENCOUNTER — Other Ambulatory Visit: Payer: Self-pay | Admitting: *Deleted

## 2018-08-12 DIAGNOSIS — G8929 Other chronic pain: Secondary | ICD-10-CM

## 2018-08-12 DIAGNOSIS — M25562 Pain in left knee: Principal | ICD-10-CM

## 2018-08-12 MED ORDER — DICLOFENAC SODIUM 75 MG PO TBEC: DELAYED_RELEASE_TABLET | ORAL | 0 refills | Status: DC

## 2018-10-28 ENCOUNTER — Ambulatory Visit (HOSPITAL_COMMUNITY)
Admission: RE | Admit: 2018-10-28 | Discharge: 2018-10-28 | Disposition: A | Discharge status: Home / Self Care | Payer: PRIVATE HEALTH INSURANCE | Source: Ambulatory Visit | Attending: Family Medicine | Admitting: Family Medicine

## 2018-10-28 ENCOUNTER — Ambulatory Visit: Payer: PRIVATE HEALTH INSURANCE | Admitting: Family Medicine

## 2018-10-28 ENCOUNTER — Other Ambulatory Visit: Payer: Self-pay

## 2018-10-28 ENCOUNTER — Encounter: Payer: Self-pay | Admitting: Family Medicine

## 2018-10-28 VITALS — BP 198/102 | HR 86 | Temp 98.4°F | Ht 61.0 in | Wt 186.0 lb

## 2018-10-28 DIAGNOSIS — M25561 Pain in right knee: Secondary | ICD-10-CM

## 2018-10-28 DIAGNOSIS — M25562 Pain in left knee: Principal | ICD-10-CM

## 2018-10-28 MED ORDER — MELOXICAM 7.5 MG PO TABS: 7.5000 mg | ORAL_TABLET | Freq: Every day | ORAL | 0 refills | Status: DC

## 2018-10-28 NOTE — Patient Instructions (Addendum)
It was nice meeting you today.  You were seen in clinic for bilateral knee pain which I suspect is due to osteoarthritis.  I have ordered some x-rays of both knees for further evaluation.  You can go to the Radiology Department on first floor of [facility] to have this done.  In the meantime, I am prescribing an anti-inflammatory called Mobic.  I also continue to use heating pad and ice in addition to the topical Voltaren gel.  We also reviewed activity modification such as avoiding stairs and walking in water as opposed to on a treadmill.  If you have any new or worsening symptoms, please make an appointment to be seen by provider.  I will call you once the results of your x-rays return.  Lovenia Kim MD

## 2018-10-28 NOTE — Progress Notes (Signed)
Subjective:   Patient ID: Pamela Schneider    DOB: April 15, 1971, 47 y.o. female   MRN: [id number]  CC: bilateral knee pain   HPI: Pamela Schneider is a 47 y.o. female who presents to clinic today for the following issue.  Bilateral knee pain  Has been going on for a couple of weeks, thought it may be from the weather.  No h/o recent fall or injury.  Pain localized to lateral aspects of both knees.  Pain does not radiate.  She has noticed popping and clicking, no catching or locking.  No numbness or tingling.  Worse with climbing stairs and relieved with rest.  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  Has noticed some swelling at end of the day.  No other joint pain.  No fevers, chills, nausea, or vomiting.  Has had some knee pain in the past but no imaging studies performed as pain was minimal.  She has been using voltaren gel at the sites. Has not tried antiinflammatories or heating pads.   ROS: No fever, chills, SOB or chest pain.  No weakness, numbness or tingling.   Social: pt is a never smoker.  Medications reviewed. Objective:   BP (!) 198/102   Pulse 86   Temp 98.4 F (36.9 C) (Oral)   Ht 5\' 1"  (1.549 m)   Wt 186 lb (84.4 kg)   LMP 10/26/2018   SpO2 99%   BMI 35.14 kg/m  Vitals and nursing note reviewed.  General: 47 yo female, NAD  Neck: supple CV: RRR no MRG  Lungs: clear, normal effort  Skin: warm, dry MSK-  R knee: Normal to inspection with no erythema or effusion or obvious bony abnormalities. Palpation normal with mild joint line tenderness over lateral aspect, no patellar tenderness.  ROM normal in flexion and extension and lower leg rotation. Ligaments with solid consistent endpoints including ACL, PCL, LCL, MCL. Negative Mcmurray's and provocative meniscal tests. Patellar and quadriceps tendons unremarkable. Hamstring and quadriceps strength is normal.  L knee: Normal to inspection with no erythema or effusion or obvious bony  abnormalities. Palpation normal with mild joint line tenderness over lateral aspect, no patellar tenderness.  ROM normal in flexion and extension and lower leg rotation. Ligaments with solid consistent endpoints including ACL, PCL, LCL, MCL. Negative Mcmurray's and provocative meniscal tests. Patellar and quadriceps tendons unremarkable. Hamstring and quadriceps strength is normal. Neurovascularly intact distally.   Assessment & Plan:   Knee pain, bilateral Suspect OA bilaterally, siscussed weight loss with her and she is motivated to do so  -Activity modification reviewed, advised to avoid stairs and use elliptical or walking in a pool instead as this will be easier on her joints.  -Obtaining plain films of both knees today -Rx: Mobic, 7.5 mg daily -Follow up: 6 weeks or sooner if needed   Orders Placed This Encounter  Procedures  . DG Knee 1-2 Views Right    Standing Status:   Future    Number of Occurrences:   1    Standing Expiration Date:   12/30/2019    Order Specific Question:   Reason for Exam (SYMPTOM  OR DIAGNOSIS REQUIRED)    Answer:   right knee pain    Order Specific Question:   Is patient pregnant?    Answer:   No    Order Specific Question:   Preferred imaging location?    Answer:   [facility]    Order  Specific Question:   Radiology Contrast Protocol - do NOT remove file path    Answer:   \\[email or web address]\Radiant\DXFluoroContrastProtocols.pdf  . DG Knee 1-2 Views Left    Standing Status:   Future    Number of Occurrences:   1    Standing Expiration Date:   12/30/2019    Order Specific Question:   Reason for Exam (SYMPTOM  OR DIAGNOSIS REQUIRED)    Answer:   left knee pain    Order Specific Question:   Is patient pregnant?    Answer:   No    Order Specific Question:   Preferred imaging location?    Answer:   [facility]    Order Specific Question:   Radiology Contrast Protocol - do NOT remove file path    Answer:    \\[email or web address]\Radiant\DXFluoroContrastProtocols.pdf   Meds ordered this encounter  Medications  . meloxicam (MOBIC) 7.5 MG tablet    Sig: Take 1 tablet (7.5 mg total) by mouth daily.    Dispense:  30 tablet    Refill:  0   Lovenia Kim, MD [place]

## 2018-11-04 ENCOUNTER — Telehealth: Payer: Self-pay | Admitting: Family Medicine

## 2018-11-04 NOTE — Telephone Encounter (Signed)
Pt has xray's done last week on her knee. She would like someone to call her back when the results have came in.

## 2018-11-04 NOTE — Telephone Encounter (Signed)
Called patient to discuss xray results, all questions answered.

## 2018-11-04 NOTE — Assessment & Plan Note (Signed)
Suspect OA bilaterally, siscussed weight loss with her and she is motivated to do so  -Activity modification reviewed, advised to avoid stairs and use elliptical or walking in a pool instead as this will be easier on her joints.  -Obtaining plain films of both knees today -Rx: Mobic, 7.5 mg daily -Follow up: 6 weeks or sooner if needed

## 2018-11-08 ENCOUNTER — Other Ambulatory Visit: Payer: Self-pay | Admitting: Family Medicine

## 2018-11-08 ENCOUNTER — Ambulatory Visit
Admission: RE | Admit: 2018-11-08 | Discharge: 2018-11-08 | Disposition: A | Discharge status: Home / Self Care | Payer: PRIVATE HEALTH INSURANCE | Source: Ambulatory Visit | Attending: Family Medicine | Admitting: Family Medicine

## 2018-11-08 DIAGNOSIS — Z1231 Encounter for screening mammogram for malignant neoplasm of breast: Secondary | ICD-10-CM

## 2018-11-10 ENCOUNTER — Other Ambulatory Visit: Payer: Self-pay | Admitting: Internal Medicine

## 2018-11-10 ENCOUNTER — Other Ambulatory Visit: Payer: Self-pay | Admitting: Family Medicine

## 2018-11-10 DIAGNOSIS — I10 Essential (primary) hypertension: Secondary | ICD-10-CM

## 2018-11-10 MED ORDER — HYDROCHLOROTHIAZIDE 25 MG PO TABS: 25.0000 mg | ORAL_TABLET | Freq: Every day | ORAL | 3 refills | Status: DC

## 2018-11-15 ENCOUNTER — Other Ambulatory Visit: Payer: Self-pay | Admitting: Family Medicine

## 2018-11-15 ENCOUNTER — Telehealth: Payer: Self-pay

## 2018-11-15 MED ORDER — MELOXICAM 15 MG PO TABS: 15.0000 mg | ORAL_TABLET | Freq: Every day | ORAL | 0 refills | Status: DC

## 2018-11-15 NOTE — Telephone Encounter (Signed)
Pt informed. Wants to know if you were going to send in any refills. Nakisha Chai Kennon Holter, CMA

## 2018-11-15 NOTE — Telephone Encounter (Signed)
Please call patient to let her know she may try increasing Meloxicam to 2 tabs (15 mg) daily and see if that helps.   Thanks

## 2018-11-15 NOTE — Telephone Encounter (Signed)
Patient left message that Meloxicam prescribed for knee pain not helping. Stated she was to call back if this occurred about possibly increasing dose.  Call back is [phone]  Danley Danker, RN [facility] RN)

## 2018-11-15 NOTE — Telephone Encounter (Signed)
Yes I have sent in refills

## 2018-12-12 ENCOUNTER — Other Ambulatory Visit: Payer: Self-pay | Admitting: Student in an Organized Health Care Education/Training Program

## 2018-12-12 DIAGNOSIS — I10 Essential (primary) hypertension: Secondary | ICD-10-CM

## 2018-12-21 ENCOUNTER — Other Ambulatory Visit: Payer: Self-pay | Admitting: Family Medicine

## 2019-01-21 ENCOUNTER — Other Ambulatory Visit: Payer: Self-pay | Admitting: Family Medicine

## 2019-02-26 ENCOUNTER — Other Ambulatory Visit: Payer: Self-pay | Admitting: Family Medicine

## 2019-03-24 ENCOUNTER — Ambulatory Visit: Payer: PRIVATE HEALTH INSURANCE | Admitting: Family Medicine

## 2019-03-24 ENCOUNTER — Encounter: Payer: Self-pay | Admitting: Family Medicine

## 2019-03-24 ENCOUNTER — Other Ambulatory Visit: Payer: Self-pay

## 2019-03-24 ENCOUNTER — Ambulatory Visit (INDEPENDENT_AMBULATORY_CARE_PROVIDER_SITE_OTHER): Payer: PRIVATE HEALTH INSURANCE | Admitting: Family Medicine

## 2019-03-24 VITALS — BP 208/92 | HR 105

## 2019-03-24 DIAGNOSIS — I10 Essential (primary) hypertension: Secondary | ICD-10-CM | POA: Diagnosis not present

## 2019-03-24 DIAGNOSIS — G8929 Other chronic pain: Secondary | ICD-10-CM

## 2019-03-24 DIAGNOSIS — M25562 Pain in left knee: Secondary | ICD-10-CM

## 2019-03-24 DIAGNOSIS — M170 Bilateral primary osteoarthritis of knee: Secondary | ICD-10-CM

## 2019-03-24 DIAGNOSIS — M25561 Pain in right knee: Secondary | ICD-10-CM | POA: Diagnosis not present

## 2019-03-24 MED ORDER — LOSARTAN POTASSIUM 100 MG PO TABS: 100.0000 mg | ORAL_TABLET | Freq: Every day | ORAL | 0 refills | Status: DC

## 2019-03-24 MED ORDER — AMLODIPINE BESYLATE 5 MG PO TABS: 5.0000 mg | ORAL_TABLET | Freq: Every day | ORAL | 3 refills | Status: DC

## 2019-03-24 MED ORDER — DICLOFENAC SODIUM 1 % TD GEL: 2.0000 g | Freq: Four times a day (QID) | TRANSDERMAL | 3 refills | Status: DC

## 2019-03-24 NOTE — Assessment & Plan Note (Signed)
Patient appears very well in office today. Denies any symptoms despite such a high blood pressure. Upon chart review, appears she has had elevated blood pressures in severe range at every visit. Some concern for secondary HTN given resistance to two medications so far.  - Begin Amlodipine 5mg  QD - Obtain renin-aldosterone ratio - renal artery ultrasound  - CMP to evaluate for end organ damage - follow up in 1 week for blood pressure recheck - Patient instructed to discontinue Meloxicam and avoid NSAIDs.

## 2019-03-24 NOTE — Patient Instructions (Signed)
It was so great to see you today. Today we discussed your blood pressure. I am concerned about how high it is. I have started you on a new blood pressure medicine called Amlodipine 5mg . Please take 1 pill once a day. Also continue your other blood pressure medicines as prescribed. I am also obtaining labs and imaging to further evaluate other causes to your resistant blood pressure. I will call you with any abnormal results. Please stop taking the Meloxicam given this can make your blood pressure worse.   Given your knee pain is well controlled at this time, please continue the Voltaren gel for pain relief. You can try over the counter tylenol for further symptom relief at this time. Please follow up in 1 week for a blood pressure check and to discuss steroid injections.

## 2019-03-24 NOTE — Progress Notes (Signed)
Subjective:   Patient ID: Pamela Schneider    DOB: 20-Jul-1971, 48 y.o. female   MRN: [id number]  Pamela Schneider is a 48 y.o. female here for knee pain.  Bilateral Knee pain: Seen by Dr. Reesa Chew on 10/28/18 for knee pain. Knee x-ray notable for tricompartmental DJD bilaterally. Given rx for meloxicam 15mg  QD and Voltaren gel BID which helps. Notes worsening pain at night that wakes her up. Notes limited help with meloxicam,but the voltaren gel seems to help a lot at this time. Denies any current pain.  HTN Urgency: Blood pressure 218/100. Home meds: HCTZ 25mg  QD and Losartan 100mg  QD. Took meds this AM. Recheck 208/92. Denies headaches, vision changes, CP, SOB. Recently increased to 100mg  QD due to BP of 230/100.  Review of Systems:  Per HPI.   [place], medications and smoking status reviewed.  Objective:   BP (!) 208/92   Pulse (!) 105   SpO2 99%  Vitals and nursing note reviewed.  General: pleasant african american lady, well nourished, well developed, in no acute distress with non-toxic appearance, sitting comfortably in exam chair HEENT: normocephalic, atraumatic, PERRL Neck: supple, normal ROM CV: regular rate and rhythm without murmurs, rubs, or gallops, no lower extremity edema, 2+ radial and pedal pulses bilaterally Lungs: clear to auscultation bilaterally with normal work of breathing Abdomen: soft, non-tender, non-distended, normoactive bowel sounds Skin: warm, dry Extremities: warm and well perfused MSK: gait normal Neuro: Alert and oriented, speech normal  Assessment & Plan:   Essential hypertension Patient appears very well in office today. Denies any symptoms despite such a high blood pressure. Upon chart review, appears she has had elevated blood pressures in severe range at every visit. Some concern for secondary HTN given resistance to two medications so far.  - Begin Amlodipine 5mg  QD - Obtain renin-aldosterone ratio - renal artery ultrasound  - CMP to  evaluate for end organ damage - follow up in 1 week for blood pressure recheck - Patient instructed to discontinue Meloxicam and avoid NSAIDs.  Knee pain, bilateral X-ray consistent with osteoarthritis. Minimal improvement with Meloxicam. Patient denied any current pain at this time. She notes the Voltaren gel (using mother's rx) has been very helpful so far. Discussed treatment with intraarticular steroid injections if pain becomes intolerable. However, given extreme blood pressures and currently asymptomatic, opted to defer steroid injections until more symptomatic and blood pressure more controlled.  - Stop Meloxicam given lack of improvement and elevated BP's - Voltaren gel sent to pharmacy - Consider steroid injections if becomes more bothersome - Patient to follow up in 1 week and can further assess at that time   Orders Placed This Encounter  Procedures  . Basic Metabolic Panel  . Aldosterone + renin activity w/ ratio  . Hepatic Function Panel   Meds ordered this encounter  Medications  . losartan (COZAAR) 100 MG tablet    Sig: Take 1 tablet (100 mg total) by mouth daily.    Dispense:  90 tablet    Refill:  0  . diclofenac sodium (VOLTAREN) 1 % GEL    Sig: Apply 2 g topically 4 (four) times daily.    Dispense:  100 g    Refill:  3  . amLODipine (NORVASC) 5 MG tablet    Sig: Take 1 tablet (5 mg total) by mouth at bedtime.    Dispense:  90 tablet    Refill:  [place], DO PGY-1, [facility] 03/24/2019 5:18  PM

## 2019-03-24 NOTE — Assessment & Plan Note (Addendum)
X-ray consistent with osteoarthritis. Minimal improvement with Meloxicam. Patient denied any current pain at this time. She notes the Voltaren gel (using mother's rx) has been very helpful so far. Discussed treatment with intraarticular steroid injections if pain becomes intolerable. However, given extreme blood pressures and currently asymptomatic, opted to defer steroid injections until more symptomatic and blood pressure more controlled.  - Stop Meloxicam given lack of improvement and elevated BP's - Voltaren gel sent to pharmacy - Consider steroid injections if becomes more bothersome - Patient to follow up in 1 week and can further assess at that time

## 2019-03-28 LAB — HEPATIC FUNCTION PANEL
ALT: 20 IU/L (ref 0–32)
AST: 20 IU/L (ref 0–40)
Albumin: 4.4 g/dL (ref 3.8–4.8)
Alkaline Phosphatase: 45 IU/L (ref 39–117)
Bilirubin Total: <0.2 mg/dL (ref 0.0–1.2)
Bilirubin, Direct: 0.06 mg/dL (ref 0.00–0.40)
Total Protein: 7.4 g/dL (ref 6.0–8.5)

## 2019-03-28 LAB — SPECIMEN STATUS REPORT

## 2019-03-29 LAB — BASIC METABOLIC PANEL
BUN/Creatinine Ratio: 19 (ref 9–23)
BUN: 18 mg/dL (ref 6–24)
CO2: 22 mmol/L (ref 20–29)
Calcium: 10 mg/dL (ref 8.7–10.2)
Chloride: 100 mmol/L (ref 96–106)
Creatinine, Ser: 0.95 mg/dL (ref 0.57–1.00)
GFR calc Af Amer: 82 mL/min/{1.73_m2} (ref >59)
GFR calc non Af Amer: 71 mL/min/{1.73_m2} (ref >59)
Glucose: 102 mg/dL — ABNORMAL HIGH (ref 65–99)
Potassium: 3.8 mmol/L (ref 3.5–5.2)
Sodium: 137 mmol/L (ref 134–144)

## 2019-03-29 LAB — ALDOSTERONE + RENIN ACTIVITY W/ RATIO
ALDOS/RENIN RATIO: 24.8 (ref 0.0–30.0)
ALDOSTERONE: 12.6 ng/dL (ref 0.0–30.0)
Renin: 0.509 ng/mL/hr (ref 0.167–5.380)

## 2019-03-31 ENCOUNTER — Ambulatory Visit (HOSPITAL_COMMUNITY)
Admission: RE | Admit: 2019-03-31 | Discharge: 2019-03-31 | Disposition: A | Discharge status: Home / Self Care | Payer: PRIVATE HEALTH INSURANCE | Source: Ambulatory Visit | Attending: Family Medicine | Admitting: Family Medicine

## 2019-03-31 ENCOUNTER — Other Ambulatory Visit: Payer: Self-pay

## 2019-03-31 ENCOUNTER — Ambulatory Visit (INDEPENDENT_AMBULATORY_CARE_PROVIDER_SITE_OTHER): Payer: PRIVATE HEALTH INSURANCE | Admitting: Family Medicine

## 2019-03-31 VITALS — BP 164/94 | HR 97

## 2019-03-31 DIAGNOSIS — R358 Other polyuria: Secondary | ICD-10-CM | POA: Diagnosis not present

## 2019-03-31 DIAGNOSIS — R3589 Other polyuria: Secondary | ICD-10-CM

## 2019-03-31 DIAGNOSIS — I10 Essential (primary) hypertension: Secondary | ICD-10-CM

## 2019-03-31 NOTE — Progress Notes (Signed)
Renal artery duplex doppler has been completed. Results located under CV proc.  Darlina Sicilian RDCS

## 2019-03-31 NOTE — Progress Notes (Addendum)
   Subjective   Patient ID: Pamela Schneider    DOB: 1971-03-03, 48 y.o. female   MRN: [id number]  CC: "blood pressure follow-up"  HPI: Pamela Schneider is a 48 y.o. female who presents to clinic today for the following:  Hyeprtension: Ms. Uhlir has a long history of hypertension for several years and was recently seen 1 week ago with BP 208/92.  She did not have any signs or symptoms of endorgan damage.  Today she denies vision changes, chest pain, shortness of breath, decrease in urination.  She has been on an increased dose of losartan since November, but otherwise has not had any changes to her medications.  She is a never smoker.  She denies snoring, headache, daytime fatigue.  She is currently undergoing investigation for secondary causes of hypertension.  ROS: see HPI for pertinent.  [place]: Reviewed. Smoking status reviewed. Medications reviewed.  Objective   BP (!) 164/94   Pulse 97   LMP 03/28/2019 (Exact Date)   SpO2 99%  Vitals and nursing note reviewed.  General: well nourished, well developed, NAD with non-toxic appearance HEENT: normocephalic, atraumatic, moist mucous membranes Neck: supple, non-tender without lymphadenopathy Cardiovascular: regular rate and rhythm without murmurs, rubs, or gallops Lungs: clear to auscultation bilaterally with normal work of breathing Abdomen: obese, soft, non-tender, non-distended, normoactive bowel sounds Skin: warm, dry, no rashes or lesions Extremities: warm and well perfused, normal tone, no edema Neuro: grossly intact throughout, no dysarthria  Assessment & Plan   Resistant hypertension Longstanding hypertensive with poor control.  Non-smoker.  Currently being worked up for secondary causes of hypertension with normal renin aldosterone level, negative for RAS.  There are no clear medications or OTC meds to explain her symptoms.  She did have regular use of Advil for her right knee pain for several weeks, however she did  not have excessive amounts of NSAIDs to explain her BP as of recently.  No recent microalbumin creatinine ratio per chart review.  Does have symptoms of polyuria without recent changes to medications.  No prior A1c. - Advised to increase amlodipine to 10 mg nightly, continue losartan 100 mg nightly, and continue HCTZ 25 mg daily - Screening for diabetes with A1c and checking microalbumin creatinine ratio  Orders Placed This Encounter  Procedures  . Hemoglobin A1c  . Microalbumin/Creatinine Ratio, Urine   No orders of the defined types were placed in this encounter.   Harriet Butte, [place], PGY-3 03/31/2019, 10:46 AM

## 2019-03-31 NOTE — Patient Instructions (Signed)
Thank you for coming in to see [place] today. Please see below to review our plan for today's visit.  1.  I will call you if your lab results are abnormal usually within 48 hours, otherwise you should receive results in the mail. 2.  Increase your amlodipine to 10 mg daily.  Take your amlodipine and losartan in the evening and your HCTZ in the morning. 3.  We will have our office schedule an appointment with your primary care physician to follow-up.  Please call the clinic at [phone] if your symptoms worsen or you have any concerns. It was our pleasure to serve you.  Harriet Butte, [place], PGY-3

## 2019-03-31 NOTE — Assessment & Plan Note (Signed)
Longstanding hypertensive with poor control.  Non-smoker.  Currently being worked up for secondary causes of hypertension with normal renin aldosterone level, negative for RAS.  There are no clear medications or OTC meds to explain her symptoms.  She did have regular use of Advil for her right knee pain for several weeks, however she did not have excessive amounts of NSAIDs to explain her BP as of recently.  No recent microalbumin creatinine ratio per chart review.  Does have symptoms of polyuria without recent changes to medications.  No prior A1c. - Advised to increase amlodipine to 10 mg nightly, continue losartan 100 mg nightly, and continue HCTZ 25 mg daily - Screening for diabetes with A1c and checking microalbumin creatinine ratio

## 2019-04-01 ENCOUNTER — Other Ambulatory Visit: Payer: Self-pay | Admitting: Family Medicine

## 2019-04-01 ENCOUNTER — Other Ambulatory Visit: Payer: Self-pay | Admitting: Internal Medicine

## 2019-04-01 ENCOUNTER — Encounter: Payer: Self-pay | Admitting: Family Medicine

## 2019-04-01 DIAGNOSIS — K219 Gastro-esophageal reflux disease without esophagitis: Secondary | ICD-10-CM

## 2019-04-01 DIAGNOSIS — I10 Essential (primary) hypertension: Secondary | ICD-10-CM

## 2019-04-01 LAB — HEMOGLOBIN A1C
Est. average glucose Bld gHb Est-mCnc: 108 mg/dL
Hgb A1c MFr Bld: 5.4 % (ref 4.8–5.6)

## 2019-04-01 LAB — MICROALBUMIN / CREATININE URINE RATIO
Creatinine, Urine: 12.5 mg/dL
Microalb/Creat Ratio: <24 mg/g creat (ref 0–29)
Microalbumin, Urine: <3 ug/mL

## 2019-06-19 ENCOUNTER — Other Ambulatory Visit: Payer: Self-pay | Admitting: Family Medicine

## 2019-06-19 DIAGNOSIS — I10 Essential (primary) hypertension: Secondary | ICD-10-CM

## 2019-07-12 ENCOUNTER — Other Ambulatory Visit: Payer: Self-pay

## 2019-07-12 DIAGNOSIS — I10 Essential (primary) hypertension: Secondary | ICD-10-CM

## 2019-07-12 MED ORDER — HYDROCHLOROTHIAZIDE 25 MG PO TABS: 25.0000 mg | ORAL_TABLET | Freq: Every day | ORAL | 2 refills | Status: DC

## 2019-07-12 NOTE — Telephone Encounter (Signed)
Please schedule patient for a hypertension follow up.   Pamela Clan, DO

## 2019-07-13 NOTE — Telephone Encounter (Signed)
Attempted to call pt voicemail full, will try again later. Helen Cuff Kennon Holter, CMA

## 2019-10-13 ENCOUNTER — Other Ambulatory Visit: Payer: Self-pay | Admitting: Family Medicine

## 2019-10-13 DIAGNOSIS — I10 Essential (primary) hypertension: Secondary | ICD-10-CM

## 2019-11-09 ENCOUNTER — Other Ambulatory Visit: Payer: Self-pay

## 2019-11-09 DIAGNOSIS — Z20822 Contact with and (suspected) exposure to covid-19: Secondary | ICD-10-CM

## 2019-11-10 LAB — NOVEL CORONAVIRUS, NAA: SARS-CoV-2, NAA: NOT DETECTED

## 2019-11-13 ENCOUNTER — Other Ambulatory Visit: Payer: Self-pay | Admitting: Family Medicine

## 2019-11-13 DIAGNOSIS — I10 Essential (primary) hypertension: Secondary | ICD-10-CM

## 2019-11-21 ENCOUNTER — Other Ambulatory Visit: Payer: Self-pay | Admitting: Family Medicine

## 2019-11-21 DIAGNOSIS — I10 Essential (primary) hypertension: Secondary | ICD-10-CM

## 2019-11-21 NOTE — Telephone Encounter (Signed)
Pt called and scheduled appointment for follow up BP check.   Talbot Grumbling, RN

## 2019-12-06 ENCOUNTER — Encounter: Payer: Self-pay | Admitting: Family Medicine

## 2019-12-06 ENCOUNTER — Other Ambulatory Visit: Payer: Self-pay

## 2019-12-06 ENCOUNTER — Ambulatory Visit (INDEPENDENT_AMBULATORY_CARE_PROVIDER_SITE_OTHER): Payer: PRIVATE HEALTH INSURANCE | Admitting: Family Medicine

## 2019-12-06 VITALS — BP 158/84 | HR 101 | Wt 173.0 lb

## 2019-12-06 DIAGNOSIS — R011 Cardiac murmur, unspecified: Secondary | ICD-10-CM | POA: Diagnosis not present

## 2019-12-06 DIAGNOSIS — I10 Essential (primary) hypertension: Secondary | ICD-10-CM | POA: Diagnosis not present

## 2019-12-06 DIAGNOSIS — Z862 Personal history of diseases of the blood and blood-forming organs and certain disorders involving the immune mechanism: Secondary | ICD-10-CM

## 2019-12-06 NOTE — Patient Instructions (Addendum)
It was so wonderful seeing you today.  I would like you to be very mindful about following a well-balanced diet and increasing your physical activity.  We discussed starting a another blood pressure medication today, however we are going to see which you can do with lifestyle changes. I want you to have your blood pressure taken around about 3 times a week and keep a journal of these.  Goal would be under 130/80, if your blood pressure seems to be around this that it is okay for [place] to continue as is and trying to make changes lifestyle wise, if your blood pressures consistently around 150-160-170 on the top number, then we really do need to reconsider starting another medicine.  Instead of the atorvastatin, I will send in a medication called Crestor.  We will start at a low-dose to help reduce any symptoms.

## 2019-12-06 NOTE — Progress Notes (Signed)
   Subjective:    Patient ID: Pamela Schneider, female    DOB: 1971-04-29, 49 y.o.   MRN: [id number]   CC: follow up BP   HPI: Ms. Pamela Schneider is a 49 year old female with hypertension, GERD, OA, and previous anemia presenting discuss the following:  Hypertension: Last seen in 03/2019, blood pressure significantly elevated at that time.  Increased her amlodipine to 10 mg.  Since that time, says she is been doing very well.  Denies any shortness of breath, chest pain, headache, dizziness/lightheadedness, difficulty urinating.  She stopped taking her Norvasc several months ago due to making her feel not well.  Taking HCTZ 25 mg and losartan 100 mg with compliance.  Has not checked her blood pressure since she was last here in May.  Feels like her blood pressure may be higher while at the doctor's office.  She has lost 13 pounds since 10/2018.  Walks her dog for exercise.  Works at [employer]  Stopped taking atorvastatin several months ago as well because she was told to discontinue this and had myalgias.   Objective:  BP (!) 158/84   Pulse (!) 101   Wt 173 lb (78.5 kg)   LMP 12/04/2019   SpO2 99%   BMI 32.69 kg/m  Vitals and nursing note reviewed  General: NAD, pleasant Cardiac: RRR, normal heart sounds, 2/6 systolic murmur best heard in the left intercostal space Respiratory: CTAB, normal effort Abdomen: soft Extremities: no edema with distal palpable pulses Skin: warm and dry, no rashes noted Neuro: alert and oriented, no focal deficits Psych: normal affect  Assessment & Plan:   Essential hypertension Uncontrolled through office visit values, however notably improved from previous visits despite having one less antihypertensive on board (self-discontinued Norvasc).  She is hesitant to add an additional agent and appears to be motivated towards working on lifestyle modifications including increasing physical activity and following a well-balanced diet.  Through shared decision  making, decided we would give a trial of these modifications with periodic monitoring of her BP on outpatient basis with a home journal.  Will have her follow-up in 2 months to to see progress, instructed to f/u sooner if her BP is consistently >150/90.  Will also obtain a repeat BMP today to ensure appropriate creatinine, WNL in 03/2019.   Systolic murmur Noted 2/6 systolic murmur in the pulmonic listening post.  No concerning symptomatology.  Reports she has been told periodically over the last several years that she has a murmur (other times has been told that she does not).  Recommend in-depth cardiac examination on follow-up, may need to consider an echocardiogram for further evaluation.  Will obtain a CBC today to rule out any worsening anemia given her previous history of iron deficiency anemia in the setting of menorrhagia.   ASCVD 10-year risk 3.9%, does not need to be on a statin at this time.  Will continue to periodically monitor cholesterol.  Follow-up in 2 months or sooner if needed as discussed above.  [place] [facility] Resident PGY-2

## 2019-12-07 ENCOUNTER — Encounter: Payer: Self-pay | Admitting: Family Medicine

## 2019-12-07 DIAGNOSIS — R011 Cardiac murmur, unspecified: Secondary | ICD-10-CM | POA: Insufficient documentation

## 2019-12-07 LAB — BASIC METABOLIC PANEL
BUN/Creatinine Ratio: 17 (ref 9–23)
BUN: 14 mg/dL (ref 6–24)
CO2: 24 mmol/L (ref 20–29)
Calcium: 9.4 mg/dL (ref 8.7–10.2)
Chloride: 103 mmol/L (ref 96–106)
Creatinine, Ser: 0.83 mg/dL (ref 0.57–1.00)
GFR calc Af Amer: 96 mL/min/{1.73_m2} (ref >59)
GFR calc non Af Amer: 83 mL/min/{1.73_m2} (ref >59)
Glucose: 89 mg/dL (ref 65–99)
Potassium: 3.7 mmol/L (ref 3.5–5.2)
Sodium: 142 mmol/L (ref 134–144)

## 2019-12-07 LAB — CBC
Hematocrit: 38 % (ref 34.0–46.6)
Hemoglobin: 11.9 g/dL (ref 11.1–15.9)
MCH: 21.8 pg — ABNORMAL LOW (ref 26.6–33.0)
MCHC: 31.3 g/dL — ABNORMAL LOW (ref 31.5–35.7)
MCV: 70 fL — ABNORMAL LOW (ref 79–97)
Platelets: 216 10*3/uL (ref 150–450)
RBC: 5.46 x10E6/uL — ABNORMAL HIGH (ref 3.77–5.28)
RDW: 16.8 % — ABNORMAL HIGH (ref 11.7–15.4)
WBC: 3.1 10*3/uL — ABNORMAL LOW (ref 3.4–10.8)

## 2019-12-07 NOTE — Assessment & Plan Note (Addendum)
Noted 2/6 systolic murmur in the pulmonic listening post.  No concerning symptomatology.  Reports she has been told periodically over the last several years that she has a murmur (other times has been told that she does not).  Recommend in-depth cardiac examination on follow-up, may need to consider an echocardiogram for further evaluation.  Will obtain a CBC today to rule out any worsening anemia given her previous history of iron deficiency anemia in the setting of menorrhagia.

## 2019-12-07 NOTE — Assessment & Plan Note (Signed)
Uncontrolled through office visit values, however notably improved from previous visits despite having one less antihypertensive on board (self-discontinued Norvasc).  She is hesitant to add an additional agent and appears to be motivated towards working on lifestyle modifications including increasing physical activity and following a well-balanced diet.  Through shared decision making, decided we would give a trial of these modifications with periodic monitoring of her BP on outpatient basis with a home journal.  Will have her follow-up in 2 months to to see progress, instructed to f/u sooner if her BP is consistently >150/90.  Will also obtain a repeat BMP today to ensure appropriate creatinine, WNL in 03/2019.

## 2019-12-08 ENCOUNTER — Other Ambulatory Visit: Payer: Self-pay | Admitting: Family Medicine

## 2019-12-08 DIAGNOSIS — Z1231 Encounter for screening mammogram for malignant neoplasm of breast: Secondary | ICD-10-CM

## 2019-12-22 ENCOUNTER — Other Ambulatory Visit: Payer: Self-pay | Admitting: Family Medicine

## 2019-12-22 DIAGNOSIS — I10 Essential (primary) hypertension: Secondary | ICD-10-CM

## 2019-12-27 ENCOUNTER — Ambulatory Visit: Payer: PRIVATE HEALTH INSURANCE | Admitting: Family Medicine

## 2019-12-27 VITALS — BP 190/94 | HR 79 | Ht 61.0 in | Wt 173.0 lb

## 2019-12-27 DIAGNOSIS — Z789 Other specified health status: Secondary | ICD-10-CM

## 2019-12-27 DIAGNOSIS — I10 Essential (primary) hypertension: Secondary | ICD-10-CM

## 2019-12-27 DIAGNOSIS — R011 Cardiac murmur, unspecified: Secondary | ICD-10-CM

## 2019-12-27 NOTE — Progress Notes (Signed)
Subjective:     Patient ID: Pamela Schneider, female   DOB: 08-26-1971, 49 y.o.   MRN: [id number]  HPI  [facility] her required wellness visit for her insurance. Her PCP is Dr. Higinio Plan, who she last saw earlier this month. She is UTD on her pap smear. Her mammogram is scheduled for the beginning of March. She will be due for her first colonoscopy as she turns 7 in June.   Hypertension: Pt reports she has not been monitoring her BP at home as she was instructed to do by her PCP. She states she is working on healthy eating, decreasing fried foods, increasing veggie intake. She states she is not working out like she would like to, restricted by the weather and her work schedule. She states she is trying to do an 8 minute workout video she follows on facebook.   She denies any chest pain, shortness of breath, headache, vision changes.   Past Medical History:  Diagnosis Date  . CHEST PAIN, ATYPICAL 08/08/2010   Qualifier: Diagnosis of  By: Loraine Maple MD, Jacquelyn    . Hypertension    No Known Allergies  Current Outpatient Medications:  .  ferrous sulfate 324 (65 Fe) MG TBEC, Take 1 tablet (325 mg total) by mouth daily., Disp: 30 tablet, Rfl: 5 .  fluticasone (FLONASE) 50 MCG/ACT nasal spray, Place 1 spray into both nostrils daily. 1 spray in each nostril every day, Disp: 16 g, Rfl: 5 .  hydrochlorothiazide (HYDRODIURIL) 25 MG tablet, Take 1 tablet (25 mg total) by mouth daily. PLEASE SCHEDULE HYPERTENSION F/U, Disp: 90 tablet, Rfl: 0 .  losartan (COZAAR) 100 MG tablet, Take 1 tablet (100 mg total) by mouth daily., Disp: 90 tablet, Rfl: 1 .  Multiple Vitamin (MULTIVITAMIN WITH MINERALS) TABS tablet, Take 1 tablet by mouth daily., Disp: , Rfl:  .  omeprazole (PRILOSEC) 20 MG capsule, TAKE ONE CAPSULE BY MOUTH DAILY, Disp: 30 capsule, Rfl: 9 .  diclofenac sodium (VOLTAREN) 1 % GEL, Apply 2 g topically 4 (four) times daily., Disp: 100 g, Rfl: 3  Review  of Systems  Constitutional: Negative for chills, fatigue, fever and unexpected weight change.  HENT: Negative for congestion, ear pain, sinus pressure, sinus pain and sore throat.   Eyes: Negative for discharge and visual disturbance.  Respiratory: Negative for cough, shortness of breath and wheezing.   Cardiovascular: Negative for chest pain, palpitations and leg swelling.  Gastrointestinal: Negative for abdominal pain, blood in stool, constipation, diarrhea, nausea and vomiting.  Genitourinary: Negative for difficulty urinating and hematuria.  Skin: Negative for color change.  Neurological: Negative for dizziness, weakness, light-headedness and headaches.  Hematological: Negative for adenopathy.  All other systems reviewed and are negative.      Objective:   Physical Exam Vitals reviewed.  Constitutional:      General: She is not in acute distress.    Appearance: Normal appearance. She is not toxic-appearing.  HENT:     Head: Normocephalic and atraumatic.  Eyes:     General:        Right eye: No discharge.        Left eye: No discharge.  Cardiovascular:     Rate and Rhythm: Normal rate and regular rhythm.     Heart sounds: Murmur (2/6 murmur auscultated at pulmonic listening point.) present.  Pulmonary:     Effort: Pulmonary effort is normal. No respiratory distress.     Breath sounds: Normal breath sounds.  Skin:    General: Skin is warm and dry.  Neurological:     Mental Status: She is alert and oriented to person, place, and time.  Psychiatric:        Mood and Affect: Mood normal.        Behavior: Behavior normal.    Today's Vitals   12/27/19 1111  BP: (!) 190/94  Pulse: 79  SpO2: 97%  Weight: 173 lb (78.5 kg)  Height: 5\' 1"  (1.549 m)   Body mass index is 32.69 kg/m.     Assessment:     1. Participant in health and wellness plan Pt is UTD on wellness screenings. Encouraged healthy diet and exercise. F/u here prn.   2. Resistant hypertension Blood  pressure was significantly elevated today. She is asymptomatic. She is compliant with her current medications. Discussed with pt importance of getting BP under control and risks of heart attack and stroke with uncontrolled BP. Sometimes it takes multiple medications to get BP readings where we need them to be. She agreed to contact her PCP today to discuss adding a third BP agent. Encouraged her to keep checking her BP at home, she can come get it checked here in the clinic when I'm here on Tuesdays as well. She knows to go to ED should she develop severe headache, vision changes, slurred speech, weakness, chest pain, shortness of breath or other concerning symptoms. Information given on DASH diet. Encouraged exercise.   3. Systolic murmur 2/6 murmur auscultated at pulmonic listening point. Recommend further evaluation by cardiologist. She should f/u with her PCP regarding referral for this.      Plan:     See above

## 2019-12-27 NOTE — Patient Instructions (Addendum)
- Please call your primary care doctor today regarding your high blood pressure. I think you need to be started on an additional medication to get this under better control.  -Monitor your blood pressure and keep a log of your readings for your doctor to review. -Should you develop severe headache, vision changes, chest pain, shortness of breath, slurred speech, weakness, or other concerning symptoms seek emergency help, call 911 go to ER.     DASH Eating Plan DASH stands for "Dietary Approaches to Stop Hypertension." The DASH eating plan is a healthy eating plan that has been shown to reduce high blood pressure (hypertension). It may also reduce your risk for type 2 diabetes, heart disease, and stroke. The DASH eating plan may also help with weight loss. What are tips for following this plan?  General guidelines  Avoid eating more than 2,300 mg (milligrams) of salt (sodium) a day. If you have hypertension, you may need to reduce your sodium intake to 1,500 mg a day.  Limit alcohol intake to no more than 1 drink a day for nonpregnant women and 2 drinks a day for men. One drink equals 12 oz of beer, 5 oz of wine, or 1 oz of hard liquor.  Work with your health care provider to maintain a healthy body weight or to lose weight. Ask what an ideal weight is for you.  Get at least 30 minutes of exercise that causes your heart to beat faster (aerobic exercise) most days of the week. Activities may include walking, swimming, or biking.  Work with your health care provider or diet and nutrition specialist (dietitian) to adjust your eating plan to your individual calorie needs. Reading food labels   Check food labels for the amount of sodium per serving. Choose foods with less than 5 percent of the Daily Value of sodium. Generally, foods with less than 300 mg of sodium per serving fit into this eating plan.  To find whole grains, look for the word "whole" as the first word in the ingredient  list. Shopping  Buy products labeled as "low-sodium" or "no salt added."  Buy fresh foods. Avoid canned foods and premade or frozen meals. Cooking  Avoid adding salt when cooking. Use salt-free seasonings or herbs instead of table salt or sea salt. Check with your health care provider or pharmacist before using salt substitutes.  Do not fry foods. Cook foods using healthy methods such as baking, boiling, grilling, and broiling instead.  Cook with heart-healthy oils, such as olive, canola, soybean, or sunflower oil. Meal planning  Eat a balanced diet that includes: ? 5 or more servings of fruits and vegetables each day. At each meal, try to fill half of your plate with fruits and vegetables. ? Up to 6-8 servings of whole grains each day. ? Less than 6 oz of lean meat, poultry, or fish each day. A 3-oz serving of meat is about the same size as a deck of cards. One egg equals 1 oz. ? 2 servings of low-fat dairy each day. ? A serving of nuts, seeds, or beans 5 times each week. ? Heart-healthy fats. Healthy fats called Omega-3 fatty acids are found in foods such as flaxseeds and coldwater fish, like sardines, salmon, and mackerel.  Limit how much you eat of the following: ? Canned or prepackaged foods. ? Food that is high in trans fat, such as fried foods. ? Food that is high in saturated fat, such as fatty meat. ? Sweets, desserts, sugary drinks,  and other foods with added sugar. ? Full-fat dairy products.  Do not salt foods before eating.  Try to eat at least 2 vegetarian meals each week.  Eat more home-cooked food and less restaurant, buffet, and fast food.  When eating at a restaurant, ask that your food be prepared with less salt or no salt, if possible. What foods are recommended? The items listed may not be a complete list. Talk with your dietitian about what dietary choices are best for you. Grains Whole-grain or whole-wheat bread. Whole-grain or whole-wheat pasta. Brown  rice. [place]. Bulgur. Whole-grain and low-sodium cereals. Pita bread. Low-fat, low-sodium crackers. Whole-wheat flour tortillas. Vegetables Fresh or frozen vegetables (raw, steamed, roasted, or grilled). Low-sodium or reduced-sodium tomato and vegetable juice. Low-sodium or reduced-sodium tomato sauce and tomato paste. Low-sodium or reduced-sodium canned vegetables. Fruits All fresh, dried, or frozen fruit. Canned fruit in natural juice (without added sugar). Meat and other protein foods Skinless chicken or [place]. Ground chicken or [place]. Pork with fat trimmed off. Fish and seafood. Egg whites. Dried beans, peas, or lentils. Unsalted nuts, nut butters, and seeds. Unsalted canned beans. Lean cuts of beef with fat trimmed off. Low-sodium, lean deli meat. Dairy Low-fat (1%) or fat-free (skim) milk. Fat-free, low-fat, or reduced-fat cheeses. Nonfat, low-sodium ricotta or cottage cheese. Low-fat or nonfat yogurt. Low-fat, low-sodium cheese. Fats and oils Soft margarine without trans fats. Vegetable oil. Low-fat, reduced-fat, or light mayonnaise and salad dressings (reduced-sodium). Canola, safflower, olive, soybean, and sunflower oils. Avocado. Seasoning and other foods Herbs. Spices. Seasoning mixes without salt. Unsalted popcorn and pretzels. Fat-free sweets. What foods are not recommended? The items listed may not be a complete list. Talk with your dietitian about what dietary choices are best for you. Grains Baked goods made with fat, such as croissants, muffins, or some breads. Dry pasta or rice meal packs. Vegetables Creamed or fried vegetables. Vegetables in a cheese sauce. Regular canned vegetables (not low-sodium or reduced-sodium). Regular canned tomato sauce and paste (not low-sodium or reduced-sodium). Regular tomato and vegetable juice (not low-sodium or reduced-sodium). Angie Fava. Olives. Fruits Canned fruit in a light or heavy syrup. Fried fruit. Fruit in cream or butter  sauce. Meat and other protein foods Fatty cuts of meat. Ribs. Fried meat. Berniece Salines. Sausage. Bologna and other processed lunch meats. Salami. Fatback. Hotdogs. Bratwurst. Salted nuts and seeds. Canned beans with added salt. Canned or smoked fish. Whole eggs or egg yolks. Chicken or [place] with skin. Dairy Whole or 2% milk, cream, and half-and-half. Whole or full-fat cream cheese. Whole-fat or sweetened yogurt. Full-fat cheese. Nondairy creamers. Whipped toppings. Processed cheese and cheese spreads. Fats and oils Butter. Stick margarine. Lard. Shortening. Ghee. Bacon fat. Tropical oils, such as coconut, palm kernel, or palm oil. Seasoning and other foods Salted popcorn and pretzels. Onion salt, garlic salt, seasoned salt, table salt, and sea salt. Worcestershire sauce. Tartar sauce. Barbecue sauce. Teriyaki sauce. Soy sauce, including reduced-sodium. Steak sauce. Canned and packaged gravies. Fish sauce. Oyster sauce. Cocktail sauce. Horseradish that you find on the shelf. Ketchup. Mustard. Meat flavorings and tenderizers. Bouillon cubes. Hot sauce and Tabasco sauce. Premade or packaged marinades. Premade or packaged taco seasonings. Relishes. Regular salad dressings. Where to find more information:  National Heart, Lung, and [place]: https://wilson-eaton.com/  American Heart Association: www.heart.org Summary  The DASH eating plan is a healthy eating plan that has been shown to reduce high blood pressure (hypertension). It may also reduce your risk for type 2 diabetes, heart disease, and stroke.  With the DASH eating plan, you should limit salt (sodium) intake to 2,300 mg a day. If you have hypertension, you may need to reduce your sodium intake to 1,500 mg a day.  When on the DASH eating plan, aim to eat more fresh fruits and vegetables, whole grains, lean proteins, low-fat dairy, and heart-healthy fats.  Work with your health care provider or diet and nutrition specialist (dietitian) to adjust  your eating plan to your individual calorie needs. This information is not intended to replace advice given to you by your health care provider. Make sure you discuss any questions you have with your health care provider. Document Revised: 10/02/2017 Document Reviewed: 10/13/2016 Elsevier Patient Education  2020 [employer].

## 2019-12-29 ENCOUNTER — Other Ambulatory Visit: Payer: Self-pay | Admitting: Family Medicine

## 2019-12-29 DIAGNOSIS — I10 Essential (primary) hypertension: Secondary | ICD-10-CM

## 2020-01-03 ENCOUNTER — Other Ambulatory Visit: Payer: Self-pay

## 2020-01-03 ENCOUNTER — Ambulatory Visit
Admission: RE | Admit: 2020-01-03 | Discharge: 2020-01-03 | Disposition: A | Discharge status: Home / Self Care | Payer: PRIVATE HEALTH INSURANCE | Source: Ambulatory Visit | Attending: *Deleted | Admitting: *Deleted

## 2020-01-03 DIAGNOSIS — Z1231 Encounter for screening mammogram for malignant neoplasm of breast: Secondary | ICD-10-CM

## 2020-01-06 ENCOUNTER — Encounter: Payer: Self-pay | Admitting: Family Medicine

## 2020-01-06 ENCOUNTER — Other Ambulatory Visit: Payer: Self-pay

## 2020-01-06 ENCOUNTER — Ambulatory Visit (INDEPENDENT_AMBULATORY_CARE_PROVIDER_SITE_OTHER): Payer: PRIVATE HEALTH INSURANCE | Admitting: Family Medicine

## 2020-01-06 VITALS — BP 200/92 | HR 97 | Wt 176.2 lb

## 2020-01-06 DIAGNOSIS — R011 Cardiac murmur, unspecified: Secondary | ICD-10-CM

## 2020-01-06 DIAGNOSIS — I10 Essential (primary) hypertension: Secondary | ICD-10-CM

## 2020-01-06 MED ORDER — SPIRONOLACTONE 25 MG PO TABS: 25.0000 mg | ORAL_TABLET | Freq: Every day | ORAL | 0 refills | Status: DC

## 2020-01-06 NOTE — Patient Instructions (Signed)
Please start spironolactone to help with your BP. Arrive to work early and have your BP checked--keep a journal of this. Follow up in 1 week for labs and BP check. I also would like you to schedule with Dr Valentina Lucks for ambulatory blood pressure monitoring.

## 2020-01-06 NOTE — Progress Notes (Addendum)
    SUBJECTIVE:   CHIEF COMPLAINT / HPI: High blood pressure  Tamesia is a 49 year old female presenting discussed the following:  Hypertension:   Patient here for follow-up of uncontrolled arterial hypertension. Blood pressure is not well controlled at home. She is not exercising on a consistent basis, however is trying to be adherent to a low-salt diet. She currently takes losartan 100 mg and HCTZ 25 mg and is adherent to regimen.  Cardiac symptoms: none. Patient denies: chest pain, claudication, dyspnea, exertional chest pressure/discomfort, fatigue, irregular heart beat, lower extremity edema, palpitations and syncope. Cardiovascular risk factors: obesity (BMI >= 30 kg/m2) and sedentary lifestyle. Use of agents associated with hypertension: none. History of target organ damage: none.  Consistently noted BP in 123XX123 systolic or higher while at work, otherwise didn't check much at home. She previously tried amlodipine and nifedipine in the past, did not tolerated due to fatigue and lower extremity edema.  She currently feels well and has no concerns today.  Non-smoker.  PERTINENT  PMH / PSH: Hypertension, GERD, allergic rhinitis, systolic murmur  OBJECTIVE:   BP (!) 200/92   Pulse 97   Wt 176 lb 3.2 oz (79.9 kg)   SpO2 99%   BMI 33.29 kg/m   General: Alert, NAD HEENT: NCAT, MMM Cardiac: RRR, with 2/6 crescendo-descrendo systolic murmur best heard in the right second intercostal space, louder with squatting Lungs: Clear bilaterally, no increased WOB  Abdomen: soft, non-tender Msk: Moves all extremities spontaneously  Ext: Warm, dry, 2+ distal pulses, no edema   ASSESSMENT/PLAN:   Resistant hypertension Longstanding hypertensive with poor control. Previous secondary workup including Renin/aldosterone levels and renal artery duplex negative. No previous sleep study, however without OSA symptoms. Reassuringly without evidence of organ damage, Cr wnl in 12/2019, however will evaluation  cardiac function with echo as discussed below as she is at risk for developing heart failure. Will start spironolactone 25mg  for further control and follow up in 1 week for repeat BMP/K level (consistently mid normal while on ARB).  --Start spironolactone  --Cont losartan and HCTZ as is  --BMP in 1 week  --Measure BP and keep journal at home  --Set up Ambulatory BP monitoring with Dr. Valentina Lucks, may need to see hypertensive specialist in the near future  --Consider hydralazine as additional agent, avoided CCB and BB d/t previous fatigue/LE swelling with Norvasc/procardia in the past  --ED precautions discussed   Systolic murmur Likelihood of asymptomatic valvular disease. Given associated co-morbidities as discussed above, will obtain echocardiogram to further assess.    Follow up in 1 week, obtain BMP at that time.     Patriciaann Clan, [place]

## 2020-01-06 NOTE — Assessment & Plan Note (Addendum)
Longstanding hypertensive with poor control. Previous secondary workup including Renin/aldosterone levels and renal artery duplex negative. No previous sleep study, however without OSA symptoms. Reassuringly without evidence of organ damage, Cr wnl in 12/2019, however will evaluation cardiac function with echo as discussed below as she is at risk for developing heart failure. Will start spironolactone 25mg  for further control and follow up in 1 week for repeat BMP/K level (consistently mid normal while on ARB).  --Start spironolactone  --Cont losartan and HCTZ as is  --BMP in 1 week  --Measure BP and keep journal at home  --Set up Ambulatory BP monitoring with Dr. Valentina Lucks, may need to see hypertensive specialist in the near future  --Consider hydralazine as additional agent, avoided CCB and BB d/t previous fatigue/LE swelling with Norvasc/procardia in the past  --ED precautions discussed

## 2020-01-08 ENCOUNTER — Encounter: Payer: Self-pay | Admitting: Family Medicine

## 2020-01-08 NOTE — Assessment & Plan Note (Signed)
Likelihood of asymptomatic valvular disease. Given associated co-morbidities as discussed above, will obtain echocardiogram to further assess.

## 2020-01-10 ENCOUNTER — Ambulatory Visit (HOSPITAL_COMMUNITY)
Admission: RE | Admit: 2020-01-10 | Discharge: 2020-01-10 | Disposition: A | Discharge status: Home / Self Care | Payer: PRIVATE HEALTH INSURANCE | Source: Ambulatory Visit | Attending: Family Medicine | Admitting: Family Medicine

## 2020-01-10 ENCOUNTER — Other Ambulatory Visit: Payer: Self-pay

## 2020-01-10 ENCOUNTER — Ambulatory Visit: Payer: PRIVATE HEALTH INSURANCE | Admitting: Family Medicine

## 2020-01-10 VITALS — BP 196/100

## 2020-01-10 DIAGNOSIS — I10 Essential (primary) hypertension: Secondary | ICD-10-CM | POA: Insufficient documentation

## 2020-01-10 DIAGNOSIS — R079 Chest pain, unspecified: Secondary | ICD-10-CM | POA: Diagnosis not present

## 2020-01-10 LAB — ECHOCARDIOGRAM COMPLETE

## 2020-01-10 NOTE — Progress Notes (Signed)
HPI: Pt walked in today for a BP check. She states she saw her PCP last week and was started on a new BP medication. She denies any chest pain, shortness of breath, h/a, N/V, vision changes, lower extremity edema. She states she feels well. Reports having an echo scheduled for later today.   Today's Vitals   01/10/20 0912  BP: (!) 196/100   There is no height or weight on file to calculate BMI.    Plan: BP remains elevated today. Continue current medications. She has f/u appt scheduled with her PCP on 01/13/20. Recommend she keep a log of her BP and monitor at home as well. ED precautions reviewed.

## 2020-01-10 NOTE — Progress Notes (Signed)
  Echocardiogram 2D Echocardiogram has been performed.  Darlina Sicilian M 01/10/2020, 3:37 PM

## 2020-01-13 ENCOUNTER — Other Ambulatory Visit: Payer: Self-pay

## 2020-01-13 ENCOUNTER — Encounter: Payer: Self-pay | Admitting: Family Medicine

## 2020-01-13 ENCOUNTER — Ambulatory Visit (INDEPENDENT_AMBULATORY_CARE_PROVIDER_SITE_OTHER): Payer: PRIVATE HEALTH INSURANCE | Admitting: Family Medicine

## 2020-01-13 VITALS — BP 160/90 | HR 81 | Wt 176.4 lb

## 2020-01-13 DIAGNOSIS — I517 Cardiomegaly: Secondary | ICD-10-CM | POA: Diagnosis not present

## 2020-01-13 DIAGNOSIS — I10 Essential (primary) hypertension: Secondary | ICD-10-CM | POA: Diagnosis not present

## 2020-01-13 MED ORDER — INDAPAMIDE 2.5 MG PO TABS: 2.5000 mg | ORAL_TABLET | Freq: Every day | ORAL | 1 refills | Status: DC

## 2020-01-13 NOTE — Assessment & Plan Note (Addendum)
Moderate concentric.  Discussed these findings on echocardiogram with patient, suspect secondary to uncontrolled hypertension.  Working towards management as above and congratulated her on dietary efforts.

## 2020-01-13 NOTE — Patient Instructions (Signed)
It is wonderful to see you today.  Congratulations on cutting back on your salty foods, you are doing a wonderful job!  We will recheck your kidney levels and potassium today, I will let you know this by next week.  After you finish your HCTZ, we can look at switching this medication to indapamide which is a similar medication but better potency.  We can also look at increasing your spironolactone if your potassium looks good today.  Lastly, I will place referral for hypertension specialist.   Please follow-up in 1-2 weeks or sooner if needed.

## 2020-01-13 NOTE — Progress Notes (Signed)
    SUBJECTIVE:   CHIEF COMPLAINT / HPI: Follow-up blood pressure  Hypertension: Recently seen by myself on 3/5 and started spironolactone at that time, taking 25 mg.  She is already on losartan 100 mg and HCTZ 25 mg, reports compliance with her medications.  She saw the NP at her work on Tuesday, 3/9, and her BP was 196/100 at that time.  Working on getting a cuff at home. Previous intolerance to nifedipine and amlodipine.  She is asymptomatic, reports no concerns today including no chest pain, shortness of breath, lightheadedness/dizziness, headache.  Enjoys a lot of salty foods, especially chips, she has been working on cutting this down this week and has done well.  Previous secondary work-up including renin/aldosterone levels and a renal artery duplex negative.  Has not had a sleep study but without symptoms.  Had an echo performed last week showing preserved EF however with left ventricular hypertrophy and diastolic dysfunction.  PERTINENT  PMH / PSH: Hypertension, GERD, allergic rhinitis, systolic murmur  OBJECTIVE:   BP (!) 160/90   Pulse 81   Wt 176 lb 6.4 oz (80 kg)   SpO2 100%   BMI 33.33 kg/m   General: Alert, NAD HEENT: NCAT, MMM Cardiac: RRR, 2/6 systolic murmur present Lungs: Clear bilaterally, no increased WOB  Abdomen: soft, non-tender, non-distended, normoactive BS Msk: Moves all extremities spontaneously  Ext: Warm, dry, 2+ distal pulses, no edema   ASSESSMENT/PLAN:   Resistant hypertension Longstanding with poor control. Significantly elevated despite addition of spironolactone last week, however improved during today's measurement.  Will continue losartan and spironolactone as is, but transition to indapamide 2.5mg  for greater potency from HCTZ when she finishes her pills at home.  Will obtain a repeat BMP today to assess K/CR, if potassium reasonable may consider increasing spironolactone to 50 mg.  Additionally, as I suspect that she will continue to need further  medical management, will place referral for hypertensive specialist to hopefully get established in the next several months.  Left ventricular hypertrophy Moderate concentric.  Discussed these findings on echocardiogram with patient, suspect secondary to uncontrolled hypertension.  Working towards management as above and congratulated her on dietary efforts.    Follow-up in 2 weeks or sooner if needed, pending spironolactone changes, could consider BB or alpha agonist however understandably realize she felt quite fatigued on nifedipine.  Will discuss sleep study on follow-up.  Patriciaann Clan, [place]

## 2020-01-13 NOTE — Assessment & Plan Note (Signed)
Longstanding with poor control. Significantly elevated despite addition of spironolactone last week, however improved during today's measurement.  Will continue losartan and spironolactone as is, but transition to indapamide 2.5mg  for greater potency from HCTZ when she finishes her pills at home.  Will obtain a repeat BMP today to assess K/CR, if potassium reasonable may consider increasing spironolactone to 50 mg.  Additionally, as I suspect that she will continue to need further medical management, will place referral for hypertensive specialist to hopefully get established in the next several months.

## 2020-01-14 LAB — BASIC METABOLIC PANEL
BUN/Creatinine Ratio: 18 (ref 9–23)
BUN: 15 mg/dL (ref 6–24)
CO2: 23 mmol/L (ref 20–29)
Calcium: 9.7 mg/dL (ref 8.7–10.2)
Chloride: 100 mmol/L (ref 96–106)
Creatinine, Ser: 0.84 mg/dL (ref 0.57–1.00)
GFR calc Af Amer: 94 mL/min/{1.73_m2} (ref >59)
GFR calc non Af Amer: 82 mL/min/{1.73_m2} (ref >59)
Glucose: 90 mg/dL (ref 65–99)
Potassium: 3.8 mmol/L (ref 3.5–5.2)
Sodium: 139 mmol/L (ref 134–144)

## 2020-01-27 ENCOUNTER — Other Ambulatory Visit: Payer: Self-pay

## 2020-01-27 ENCOUNTER — Ambulatory Visit (INDEPENDENT_AMBULATORY_CARE_PROVIDER_SITE_OTHER): Payer: PRIVATE HEALTH INSURANCE | Admitting: Family Medicine

## 2020-01-27 ENCOUNTER — Encounter: Payer: Self-pay | Admitting: Family Medicine

## 2020-01-27 VITALS — BP 178/86 | HR 88 | Ht 61.0 in | Wt 170.6 lb

## 2020-01-27 DIAGNOSIS — I10 Essential (primary) hypertension: Secondary | ICD-10-CM | POA: Diagnosis not present

## 2020-01-27 MED ORDER — HYDRALAZINE HCL 25 MG PO TABS: 25.0000 mg | ORAL_TABLET | Freq: Two times a day (BID) | ORAL | 1 refills | Status: DC

## 2020-01-27 NOTE — Patient Instructions (Addendum)
Wonderful to see you as always! We have started hydralazine, which is normally a three times daily medication, however we will start with morning and night as this is better for your schedule. I will recheck your potassium today and make sure this is normal.   Please call them and get an appointment. Follow up in 2 weeks virtually with bp readings from home.    [facility] (Dr Hilty's office): [phone]

## 2020-01-27 NOTE — Assessment & Plan Note (Addendum)
Uncontrolled, currently on 3 drug regimen. Asymptomatic. Will recheck K today due to recently doubling spironolactone. Awaiting cardiology evaluation for resistant hypertension management,  provided with office phone number to contact.  However, in the meantime for further control will start hydralazine 25 mg twice daily (challenging compliance if TID due to work schedule).  Recommended monitoring BP at home daily and bring journal in to office appointments.

## 2020-01-27 NOTE — Progress Notes (Signed)
    SUBJECTIVE:   CHIEF COMPLAINT / HPI: Hypertension follow-up  Pamela Schneider is a 49 year old female presenting discussed the following:  Hypertension: Last seen on 3/12 for this concern, increased her spironolactone to 50 mg on 3/16.  Discussed transitioning to indapamide on last visit, she still has about 10 tablets left of her HCTZ to complete before this transition. Additionally placed referral to hypertensive specialist last visit, awaiting appointment.   She is currently tolerating her medications well, other than having some joint stiffness in the morning.  Has not been able to take her blood pressure at home since last being seen in the clinic, but has been asymptomatic.  Denies any shortness of breath, chest pain, lightheadedness/dizziness, headaches.  She is willing to get a sleep study in the future, however does not feel that she has time to fit in her schedule currently.  Has not been able to increase her activity.  Previous medications tried: Norvasc and nifedipine and inability to tolerate due to severe fatigue and lower extremity swelling.   PERTINENT  PMH / PSH: Resistant hypertension, left ventricular hypertrophy, GERD, systolic murmur, osteoarthritis  OBJECTIVE:   BP (!) 178/86   Pulse 88   Ht 5\' 1"  (1.549 m)   Wt 170 lb 9.6 oz (77.4 kg)   LMP 12/28/2019 (Approximate)   SpO2 100%   BMI 32.23 kg/m   Recheck 15 minutes into appointment with BP 170/90  General: Alert, NAD HEENT: NCAT, MMM Lungs: No increased WOB  Msk: Moves all extremities spontaneously  Ext: Warm, dry, no edema   ASSESSMENT/PLAN:   Resistant hypertension Uncontrolled, currently on 3 drug regimen. Asymptomatic. Will recheck K today due to recently doubling spironolactone. Awaiting cardiology evaluation for resistant hypertension management,  provided with office phone number to contact.  However, in the meantime for further control will start hydralazine 25 mg twice daily (challenging  compliance if TID due to work schedule).  Recommended monitoring BP at home daily and bring journal in to office appointments.    Follow-up in 2 weeks with myself or cardiology.  Would like to transition to dual agents medications in the future to aid in compliance/ease.  Patriciaann Clan, [place]

## 2020-01-28 ENCOUNTER — Other Ambulatory Visit: Payer: Self-pay | Admitting: Family Medicine

## 2020-01-28 DIAGNOSIS — I10 Essential (primary) hypertension: Secondary | ICD-10-CM

## 2020-01-28 LAB — BASIC METABOLIC PANEL
BUN/Creatinine Ratio: 21 (ref 9–23)
BUN: 18 mg/dL (ref 6–24)
CO2: 24 mmol/L (ref 20–29)
Calcium: 10 mg/dL (ref 8.7–10.2)
Chloride: 102 mmol/L (ref 96–106)
Creatinine, Ser: 0.86 mg/dL (ref 0.57–1.00)
GFR calc Af Amer: 92 mL/min/{1.73_m2} (ref >59)
GFR calc non Af Amer: 80 mL/min/{1.73_m2} (ref >59)
Glucose: 82 mg/dL (ref 65–99)
Potassium: 4.2 mmol/L (ref 3.5–5.2)
Sodium: 139 mmol/L (ref 134–144)

## 2020-01-30 ENCOUNTER — Other Ambulatory Visit: Payer: Self-pay | Admitting: Family Medicine

## 2020-01-30 DIAGNOSIS — I10 Essential (primary) hypertension: Secondary | ICD-10-CM

## 2020-02-03 ENCOUNTER — Other Ambulatory Visit: Payer: Self-pay | Admitting: Family Medicine

## 2020-02-03 DIAGNOSIS — I10 Essential (primary) hypertension: Secondary | ICD-10-CM

## 2020-02-06 ENCOUNTER — Other Ambulatory Visit: Payer: Self-pay | Admitting: Family Medicine

## 2020-02-06 DIAGNOSIS — I10 Essential (primary) hypertension: Secondary | ICD-10-CM

## 2020-02-06 MED ORDER — SPIRONOLACTONE 50 MG PO TABS: 50.0000 mg | ORAL_TABLET | Freq: Every day | ORAL | 3 refills | Status: DC

## 2020-02-16 NOTE — Progress Notes (Signed)
Cardiology Office Note   Date:  02/17/2020   ID:  Pamela Schneider, DOB 07-Aug-1971, MRN [id number]  PCP:  Pamela Clan, DO  Cardiologist:   No primary care provider on file. Referring:  Pamela Clan, DO  Chief Complaint  Patient presents with  . Hypertension      History of Present Illness: Pamela Schneider is a 49 y.o. female who is referred by Pamela Clan, DO for evaluation of difficult to control hypertension.  She has very strong family history of hypertension.  She has not always been particular compliant with medications until more recently.  She has had a work-up to include negative renal Dopplers. She had an echo in March and she had an EF of 55 - 60%.  She has moderate concentric hypertrophy.  There is been no clear secondary cause.  She was given a prescription for spironolactone but has yet to start this.  She says she feels well.  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  She denies any cardiovascular symptoms. The patient denies any new symptoms such as chest discomfort, neck or arm discomfort. There has been no new shortness of breath, PND or orthopnea. There have been no reported palpitations, presyncope or syncope.   He has never had any other cardiac history.   Past Medical History:  Diagnosis Date  . CHEST PAIN, ATYPICAL 08/08/2010   Qualifier: Diagnosis of  By: Loraine Maple MD, Jacquelyn    . History of anemia 03/12/2018  . Hypertension     Past Surgical History:  Procedure Laterality Date  . TUBAL LIGATION       Current Outpatient Medications  Medication Sig Dispense Refill  . diclofenac sodium (VOLTAREN) 1 % GEL Apply 2 g topically 4 (four) times daily. 100 g 3  . ferrous sulfate 324 (65 Fe) MG TBEC Take 1 tablet (325 mg total) by mouth daily. 30 tablet 5  . fluticasone (FLONASE) 50 MCG/ACT nasal spray Place 1 spray into both nostrils daily. 1 spray in each nostril every day 16 g 5  . hydrALAZINE (APRESOLINE) 25 MG tablet Take  1 tablet (25 mg total) by mouth 2 (two) times daily. 180 tablet 3  . hydrochlorothiazide (HYDRODIURIL) 25 MG tablet Take 1 tablet (25 mg total) by mouth daily. 90 tablet 3  . indapamide (LOZOL) 2.5 MG tablet Take 1 tablet (2.5 mg total) by mouth daily. Start with 1/2 tablet for several days. 30 tablet 1  . losartan (COZAAR) 100 MG tablet Take 1 tablet (100 mg total) by mouth daily. 90 tablet 3  . Multiple Vitamin (MULTIVITAMIN WITH MINERALS) TABS tablet Take 1 tablet by mouth daily.    Marland Kitchen omeprazole (PRILOSEC) 20 MG capsule TAKE ONE CAPSULE BY MOUTH DAILY 30 capsule 9  . spironolactone (ALDACTONE) 50 MG tablet Take 1 tablet (50 mg total) by mouth at bedtime. 90 tablet 3   No current facility-administered medications for this visit.    Allergies:   Patient has no known allergies.    Social History:  The patient  reports that she has never smoked. She has never used smokeless tobacco. She reports current alcohol use of about 7.0 standard drinks of alcohol per week.   Family History:  The patient's family history includes Breast cancer in her mother; Heart disease in her father; Hypertension in her father and mother.    ROS:  Please see the history of present illness.   Otherwise, review of systems are positive for none.  All other systems are reviewed and negative.    PHYSICAL EXAM: VS:  BP (!) 190/104   Pulse 94   Ht 5\' 1"  (1.549 m)   Wt 176 lb (79.8 kg)   SpO2 99%   BMI 33.25 kg/m  , BMI Body mass index is 33.25 kg/m. GENERAL:  Well appearing HEENT:  Pupils equal round and reactive, fundi not visualized, oral mucosa unremarkable NECK:  No jugular venous distention, waveform within normal limits, carotid upstroke brisk and symmetric, no bruits, no thyromegaly LYMPHATICS:  No cervical, inguinal adenopathy LUNGS:  Clear to auscultation bilaterally BACK:  No CVA tenderness CHEST:  Unremarkable HEART:  PMI not displaced or sustained,S1 and S2 within normal limits, no S3, no S4, no  clicks, no rubs, no murmurs ABD:  Flat, positive bowel sounds normal in frequency in pitch, no bruits, no rebound, no guarding, no midline pulsatile mass, no hepatomegaly, no splenomegaly EXT:  2 plus pulses throughout, no edema, no cyanosis no clubbing SKIN:  No rashes no nodules NEURO:  Cranial nerves II through XII grossly intact, motor grossly intact throughout PSYCH:  Cognitively intact, oriented to person place and time    EKG:  EKG is ordered today. The ekg ordered today demonstrates sinus rhythm, rate 94, intervals within normal limits, no acute ST-T wave changes.   Recent Labs: 03/24/2019: ALT 20 12/06/2019: Hemoglobin 11.9; Platelets 216 01/27/2020: BUN 18; Creatinine, Ser 0.86; Potassium 4.2; Sodium 139    Lipid Panel    Component Value Date/Time   CHOL 142 03/09/2018 1655   TRIG 88 03/09/2018 1655   HDL 70 03/09/2018 1655   CHOLHDL 2.0 03/09/2018 1655   CHOLHDL 2.2 10/09/2015 1418   VLDL 22 10/09/2015 1418   LDLCALC 54 03/09/2018 1655      Wt Readings from Last 3 Encounters:  02/17/20 176 lb (79.8 kg)  01/27/20 170 lb 9.6 oz (77.4 kg)  01/13/20 176 lb 6.4 oz (80 kg)      Other studies Reviewed: Additional studies/ records that were reviewed today include: Labs. Review of the above records demonstrates:  Please see elsewhere in the note.     ASSESSMENT AND PLAN:  HTN:    Patient has hypertension which is difficult to control.  We discussed this at length.  I do agree with starting spironolactone 50 mg daily.  She will come back in about 2 weeks.  TSH and a basic metabolic profile.  She can use her hydralazine more frequently if she is having significantly elevated pressures as we see today.  She will check this again tonight.  We actually gave her a blood pressure cuff and reviewed how to use this so that she can keep a blood pressure diary.  She will need unsure multiple adjustments to her medications with her multiple medications for control.  LVH: She has had  evidence of hypertensive heart disease and we will follow this with repeat echo is going forward.  COVID EDUCATION: She has been vaccinated.  Current medicines are reviewed at length with the patient today.  The patient does not have concerns regarding medicines.  The following changes have been made:  no change  Labs/ tests ordered today include:   Orders Placed This Encounter  Procedures  . TSH  . Basic metabolic panel  . EKG 12-Lead     Disposition:   FU with HTN Clinic in one month and me in two months.     Signed, Minus Breeding, MD  02/17/2020 3:22 PM  [facility]

## 2020-02-17 ENCOUNTER — Ambulatory Visit (INDEPENDENT_AMBULATORY_CARE_PROVIDER_SITE_OTHER): Payer: PRIVATE HEALTH INSURANCE | Admitting: Cardiology

## 2020-02-17 ENCOUNTER — Other Ambulatory Visit: Payer: Self-pay

## 2020-02-17 ENCOUNTER — Encounter: Payer: Self-pay | Admitting: Cardiology

## 2020-02-17 DIAGNOSIS — I10 Essential (primary) hypertension: Secondary | ICD-10-CM | POA: Diagnosis not present

## 2020-02-17 MED ORDER — LOSARTAN POTASSIUM 100 MG PO TABS: 100.0000 mg | ORAL_TABLET | Freq: Every day | ORAL | 3 refills | Status: DC

## 2020-02-17 MED ORDER — HYDRALAZINE HCL 25 MG PO TABS: 25.0000 mg | ORAL_TABLET | Freq: Two times a day (BID) | ORAL | 3 refills | Status: DC

## 2020-02-17 MED ORDER — HYDROCHLOROTHIAZIDE 25 MG PO TABS: 25.0000 mg | ORAL_TABLET | Freq: Every day | ORAL | 3 refills | Status: DC

## 2020-02-17 MED ORDER — SPIRONOLACTONE 50 MG PO TABS: 50.0000 mg | ORAL_TABLET | Freq: Every day | ORAL | 3 refills | Status: DC

## 2020-02-17 NOTE — Patient Instructions (Signed)
Medication Instructions:  RESTART SPIRONOLACTONE 50MG  DAILY *If you need a refill on your cardiac medications before your next appointment, please call your pharmacy*  Lab Work: Your physician recommends that you return for lab work in 2 WEEKS (BMP, TSH) If you have labs (blood work) drawn and your tests are completely normal, you will receive your results only by: Marland Kitchen MyChart Message (if you have MyChart) OR . A paper copy in the mail If you have any lab test that is abnormal or we need to change your treatment, we will call you to review the results.  Testing/Procedures: NONE ORDERED THIS VISIT  Follow-Up: At [facility], you and your health needs are our priority.  As part of our continuing mission to provide you with exceptional heart care, we have created designated Provider Care Teams.  These Care Teams include your primary Cardiologist (physician) and Advanced Practice Providers (APPs -  Physician Assistants and Nurse Practitioners) who all work together to provide you with the care you need, when you need it.  Your next appointment:   2 month(s)  The format for your next appointment:   In Person  Provider:   Minus Breeding, MD   Other Instructions FOLLOW UP WITH PHARM D / [facility] FOR HTN CLINIC IN 1 MONTH

## 2020-03-06 LAB — BASIC METABOLIC PANEL
BUN/Creatinine Ratio: 28 — ABNORMAL HIGH (ref 9–23)
BUN: 31 mg/dL — ABNORMAL HIGH (ref 6–24)
CO2: 22 mmol/L (ref 20–29)
Calcium: 9.9 mg/dL (ref 8.7–10.2)
Chloride: 103 mmol/L (ref 96–106)
Creatinine, Ser: 1.09 mg/dL — ABNORMAL HIGH (ref 0.57–1.00)
GFR calc Af Amer: 69 mL/min/{1.73_m2} (ref >59)
GFR calc non Af Amer: 60 mL/min/{1.73_m2} (ref >59)
Glucose: 87 mg/dL (ref 65–99)
Potassium: 4.4 mmol/L (ref 3.5–5.2)
Sodium: 140 mmol/L (ref 134–144)

## 2020-03-06 LAB — TSH: TSH: 3.18 u[IU]/mL (ref 0.450–4.500)

## 2020-03-13 ENCOUNTER — Other Ambulatory Visit: Payer: Self-pay | Admitting: *Deleted

## 2020-03-13 DIAGNOSIS — K2100 Gastro-esophageal reflux disease with esophagitis, without bleeding: Secondary | ICD-10-CM

## 2020-03-13 MED ORDER — OMEPRAZOLE 20 MG PO CPDR: 20.0000 mg | DELAYED_RELEASE_CAPSULE | Freq: Every day | ORAL | 3 refills | Status: DC | PRN

## 2020-03-22 ENCOUNTER — Other Ambulatory Visit: Payer: Self-pay

## 2020-03-22 ENCOUNTER — Ambulatory Visit (INDEPENDENT_AMBULATORY_CARE_PROVIDER_SITE_OTHER): Payer: PRIVATE HEALTH INSURANCE | Admitting: Pharmacist

## 2020-03-22 VITALS — BP 160/88 | HR 80 | Ht 61.0 in | Wt 169.0 lb

## 2020-03-22 DIAGNOSIS — I10 Essential (primary) hypertension: Secondary | ICD-10-CM | POA: Diagnosis not present

## 2020-03-22 MED ORDER — CANDESARTAN CILEXETIL 32 MG PO TABS: 32.0000 mg | ORAL_TABLET | Freq: Every day | ORAL | 1 refills | Status: DC

## 2020-03-22 NOTE — Progress Notes (Signed)
Patient ID: Pamela Schneider                 DOB: [birth year]                      MRN: [id number]     HPI: Pamela Schneider is a 49 y.o. female referred by Dr. Percival Spanish to HTN clinic. PMH includes "resistant hypertension" , moderate LV hypertrophy, and heart murmur. Patient reports first diagnosed of hypertension in her  87s. Currently working on lifestyle modifications with ~20lbs weight loss in 11 months. Most challenging is to control her dietary sodium, but she is reading labels more and cooking most of her meals at home. Patient denies dizziness, increased fatigue, swelling, blurry vision or chest pain. Noted Aldosterone & renin test was done last year resulted within normal limits.  Current HTN meds:  Hydralazine 25mg  (7am & 8pm) Indamapide 2.5mg  daily - 7am Losartan 100mg  daily -7am Spironolactone 50mg  daily -7am  Previously tried:  Amlodipine - increased fatigue and LEE  Lisinopril - unknown reason to stop Nifedipine - increase fatigue  BP goal: <130/80  Family History: family history includes Breast cancer in her mother; Heart disease in her father; Hypertension in her father and mother  Social History: patient  reports that she has never smoked. She has never used smokeless tobacco. She reports current alcohol use of about 7.0 standard drinks of alcohol per week.   Diet: working on low sodium diet, home cooked food, and eating out very little  Exercise: activities of daily living (physically demanding job maintenance at [employer] )  Home BP readings: none provided. Patient not checking BP at home  Wt Readings from Last 3 Encounters:  03/22/20 169 lb (76.7 kg)  02/17/20 176 lb (79.8 kg)  01/27/20 170 lb 9.6 oz (77.4 kg)   BP Readings from Last 3 Encounters:  03/22/20 (!) 160/88  02/17/20 (!) 190/104  01/27/20 (!) 178/86   Pulse Readings from Last 3 Encounters:  03/22/20 80  02/17/20 94  01/27/20 88   Renal function: Estimated Creatinine Clearance: 58.5  mL/min (A) (by C-G formula based on SCr of 1.09 mg/dL (H)).  Past Medical History:  Diagnosis Date  . CHEST PAIN, ATYPICAL 08/08/2010   Qualifier: Diagnosis of  By: Loraine Maple MD, Jacquelyn    . History of anemia 03/12/2018  . Hypertension     Current Outpatient Medications on File Prior to Visit  Medication Sig Dispense Refill  . diclofenac sodium (VOLTAREN) 1 % GEL Apply 2 g topically 4 (four) times daily. 100 g 3  . ferrous sulfate 324 (65 Fe) MG TBEC Take 1 tablet (325 mg total) by mouth daily. 30 tablet 5  . hydrALAZINE (APRESOLINE) 25 MG tablet Take 1 tablet (25 mg total) by mouth 2 (two) times daily. 180 tablet 3  . indapamide (LOZOL) 2.5 MG tablet Take 1 tablet (2.5 mg total) by mouth daily. Start with 1/2 tablet for several days. 30 tablet 1  . Multiple Vitamin (MULTIVITAMIN WITH MINERALS) TABS tablet Take 1 tablet by mouth daily.    Marland Kitchen omeprazole (PRILOSEC) 20 MG capsule Take 1 capsule (20 mg total) by mouth daily as needed. 90 capsule 3  . spironolactone (ALDACTONE) 50 MG tablet Take 1 tablet (50 mg total) by mouth at bedtime. 90 tablet 3  . fluticasone (FLONASE) 50 MCG/ACT nasal spray Place 1 spray into both nostrils daily. 1 spray in each nostril every day (Patient not taking: Reported on 03/22/2020) 16 g  5   No current facility-administered medications on file prior to visit.    No Known Allergies  Blood pressure (!) 160/88, pulse 80, height 5\' 1"  (1.549 m), weight 169 lb (76.7 kg), SpO2 99 %.  Essential hypertension BP remains above goal during office visit , but greatly improved since last visit. Patient denies problems with current therapy and reports compliance with all medication. She is working on positive lifestyle modification and focusing on decreasing dietary sodium.   Will continue lifestyle modifications, STOP taking losartan, START candesartan 32mg  daily, continue all other medication and follow up in 4 weeks. Plan to increase her hydralazine to 50mg  BID and/or start  carvedilol 6.25mg  BID if additional BP control needed.  She is to monitor BP 2 times daily (if possible) and bring records for further assessment.  Sicily Zaragoza Rodriguez-Guzman PharmD, BCPS, [place] [id number] 03/23/2020 8:32 AM

## 2020-03-22 NOTE — Patient Instructions (Addendum)
Return for a  follow up appointment in 4 weeks  Check your blood pressure at home daily (if able) and keep record of the readings.  Take your BP meds as follows: *STOP taking losartan* *START taking candesartan 32mg  every evening*  Bring all of your meds, your BP cuff and your record of home blood pressures to your next appointment.  Exercise as you're able, try to walk approximately 30 minutes per day.  Keep salt intake to a minimum, especially watch canned and prepared boxed foods.  Eat more fresh fruits and vegetables and fewer canned items.  Avoid eating in fast food restaurants.    HOW TO TAKE YOUR BLOOD PRESSURE: . Rest 5 minutes before taking your blood pressure. .  Don't smoke or drink caffeinated beverages for at least 30 minutes before. . Take your blood pressure before (not after) you eat. . Sit comfortably with your back supported and both feet on the floor (don't cross your legs). . Elevate your arm to heart level on a table or a desk. . Use the proper sized cuff. It should fit smoothly and snugly around your bare upper arm. There should be enough room to slip a fingertip under the cuff. The bottom edge of the cuff should be 1 inch above the crease of the elbow. . Ideally, take 3 measurements at one sitting and record the average.

## 2020-03-23 ENCOUNTER — Encounter: Payer: Self-pay | Admitting: Pharmacist

## 2020-03-23 NOTE — Assessment & Plan Note (Signed)
BP remains above goal during office visit , but greatly improved since last visit. Patient denies problems with current therapy and reports compliance with all medication. She is working on positive lifestyle modification and focusing on decreasing dietary sodium.   Will continue lifestyle modifications, STOP taking losartan, START candesartan 32mg  daily, continue all other medication and follow up in 4 weeks. Plan to increase her hydralazine to 50mg  BID and/or start carvedilol 6.25mg  BID if additional BP control needed.  She is to monitor BP 2 times daily (if possible) and bring records for further assessment.

## 2020-03-27 ENCOUNTER — Other Ambulatory Visit: Payer: Self-pay | Admitting: Family Medicine

## 2020-03-27 DIAGNOSIS — I10 Essential (primary) hypertension: Secondary | ICD-10-CM

## 2020-04-18 DIAGNOSIS — Z7189 Other specified counseling: Secondary | ICD-10-CM | POA: Insufficient documentation

## 2020-04-18 NOTE — Progress Notes (Signed)
Cardiology Office Note   Date:  04/19/2020   ID:  Pamela Schneider, DOB 05-02-1971, MRN [id number]  PCP:  Pamela Clan, DO  Cardiologist:   No primary care provider on file. Referring:  Pamela Clan, DO  Chief Complaint  Patient presents with  . LVH      History of Present Illness: Pamela Schneider is a 49 y.o. female who is referred by Pamela Clan, DO for evaluation of difficult to control hypertension.  She has very strong family history of hypertension.  She has not always been particular compliant with medications until more recently.  She has had a work-up to include negative renal Dopplers. She had an echo in March and she had an EF of 55 - 60%.  She has moderate concentric hypertrophy.  There is been no clear secondary cause.    Since starting spironolactone her blood pressure has been better.  It is slightly elevated and she has been checking it with her watch rather than the blood pressure cuff we gave her.  She thinks it is okay by the watch.  She feels okay.  She is not having any chest discomfort, neck or arm discomfort.  She is not having any shortness of breath, PND or orthopnea.  She is not having any palpitations, presyncope or syncope.  She has no weight gain or edema.   Past Medical History:  Diagnosis Date  . CHEST PAIN, ATYPICAL 08/08/2010   Qualifier: Diagnosis of  By: Pamela Maple MD, Pamela Schneider    . History of anemia 03/12/2018  . Hypertension     Past Surgical History:  Procedure Laterality Date  . TUBAL LIGATION       Current Outpatient Medications  Medication Sig Dispense Refill  . candesartan (ATACAND) 32 MG tablet Take 1 tablet (32 mg total) by mouth at bedtime. 30 tablet 1  . diclofenac sodium (VOLTAREN) 1 % GEL Apply 2 g topically 4 (four) times daily. 100 g 3  . ferrous sulfate 324 (65 Fe) MG TBEC Take 1 tablet (325 mg total) by mouth daily. 30 tablet 5  . fluticasone (FLONASE) 50 MCG/ACT nasal spray Place 1 spray into both  nostrils daily. 1 spray in each nostril every day (Patient not taking: Reported on 03/22/2020) 16 g 5  . hydrALAZINE (APRESOLINE) 25 MG tablet Take 1 tablet (25 mg total) by mouth 2 (two) times daily. 180 tablet 3  . indapamide (LOZOL) 2.5 MG tablet Take 1 tablet (2.5 mg total) by mouth daily. Start with 1/2 tablet for several days. 30 tablet 1  . Multiple Vitamin (MULTIVITAMIN WITH MINERALS) TABS tablet Take 1 tablet by mouth daily.    Marland Kitchen omeprazole (PRILOSEC) 20 MG capsule Take 1 capsule (20 mg total) by mouth daily as needed. 90 capsule 3  . spironolactone (ALDACTONE) 50 MG tablet Take 1 tablet (50 mg total) by mouth at bedtime. 90 tablet 3   No current facility-administered medications for this visit.    Allergies:   Patient has no known allergies.    ROS:  Please see the history of present illness.   Otherwise, review of systems are positive for none.   All other systems are reviewed and negative.    PHYSICAL EXAM: VS:  BP (!) 150/88   Pulse 65   Temp (!) 97 F (36.1 C)   Ht 5\' 1"  (1.549 m)   Wt 168 lb 12.8 oz (76.6 kg)   SpO2 98%   BMI 31.89 kg/m  ,  BMI Body mass index is 31.89 kg/m. GENERAL:  Well appearing NECK:  No jugular venous distention, waveform within normal limits, carotid upstroke brisk and symmetric, no bruits, no thyromegaly LUNGS:  Clear to auscultation bilaterally CHEST:  Unremarkable HEART:  PMI not displaced or sustained,S1 and S2 within normal limits, no S3, no S4, no clicks, no rubs, no murmurs ABD:  Flat, positive bowel sounds normal in frequency in pitch, no bruits, no rebound, no guarding, no midline pulsatile mass, no hepatomegaly, no splenomegaly EXT:  2 plus pulses throughout, no edema, no cyanosis no clubbing   EKG:  EKG is not ordered today.   Recent Labs: 12/06/2019: Hemoglobin 11.9; Platelets 216 03/05/2020: BUN 31; Creatinine, Ser 1.09; Potassium 4.4; Sodium 140; TSH 3.180    Lipid Panel    Component Value Date/Time   CHOL 142 03/09/2018  1655   TRIG 88 03/09/2018 1655   HDL 70 03/09/2018 1655   CHOLHDL 2.0 03/09/2018 1655   CHOLHDL 2.2 10/09/2015 1418   VLDL 22 10/09/2015 1418   LDLCALC 54 03/09/2018 1655      Wt Readings from Last 3 Encounters:  04/19/20 168 lb 12.8 oz (76.6 kg)  03/22/20 169 lb (76.7 kg)  02/17/20 176 lb (79.8 kg)      Other studies Reviewed: Additional studies/ records that were reviewed today include: None. Review of the above records demonstrates:  Please see elsewhere in the note.     ASSESSMENT AND PLAN:  HTN:   Blood pressure is mildly elevated but much better than previous.  She is going to keep a blood pressure diary per my previous instructions.  If still not at target I will probably increase hydralazine slightly but she is much improved.   LVH:   I will follow this with repeat echocardiography in the future.  COVID EDUCATION:     She has been vaccinated.  Current medicines are reviewed at length with the patient today.  The patient does not have concerns regarding medicines.  The following changes have been made:  no change  Labs/ tests ordered today include:   No orders of the defined types were placed in this encounter.    Disposition:   FU with HTN Clinic in one month and me in two months.     Signed, Pamela Breeding, MD  04/19/2020 5:54 PM    [place]      Cardiology Office Note   Date:  04/19/2020   ID:  Pamela Schneider, DOB January 30, 1971, MRN [id number]  PCP:  Pamela Clan, DO  Cardiologist:   No primary care provider on file. Referring:  Pamela Clan, DO  Chief Complaint  Patient presents with  . LVH      History of Present Illness: Pamela Schneider is a 49 y.o. female who is referred by Pamela Clan, DO for evaluation of difficult to control hypertension.  She has very strong family history of hypertension.  She has not always been particular compliant with medications until more recently.  She has had a  work-up to include negative renal Dopplers. She had an echo in March and she had an EF of 55 - 60%.  She has moderate concentric hypertrophy.  There is been no clear secondary cause.  She was given a prescription for spironolactone but has yet to start this.  She says she feels well.  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  She denies any cardiovascular symptoms. The patient denies any  new symptoms such as chest discomfort, neck or arm discomfort. There has been no new shortness of breath, PND or orthopnea. There have been no reported palpitations, presyncope or syncope.   He has never had any other cardiac history.   Past Medical History:  Diagnosis Date  . CHEST PAIN, ATYPICAL 08/08/2010   Qualifier: Diagnosis of  By: Pamela Maple MD, Pamela Schneider    . History of anemia 03/12/2018  . Hypertension     Past Surgical History:  Procedure Laterality Date  . TUBAL LIGATION       Current Outpatient Medications  Medication Sig Dispense Refill  . candesartan (ATACAND) 32 MG tablet Take 1 tablet (32 mg total) by mouth at bedtime. 30 tablet 1  . diclofenac sodium (VOLTAREN) 1 % GEL Apply 2 g topically 4 (four) times daily. 100 g 3  . ferrous sulfate 324 (65 Fe) MG TBEC Take 1 tablet (325 mg total) by mouth daily. 30 tablet 5  . fluticasone (FLONASE) 50 MCG/ACT nasal spray Place 1 spray into both nostrils daily. 1 spray in each nostril every day (Patient not taking: Reported on 03/22/2020) 16 g 5  . hydrALAZINE (APRESOLINE) 25 MG tablet Take 1 tablet (25 mg total) by mouth 2 (two) times daily. 180 tablet 3  . indapamide (LOZOL) 2.5 MG tablet Take 1 tablet (2.5 mg total) by mouth daily. Start with 1/2 tablet for several days. 30 tablet 1  . Multiple Vitamin (MULTIVITAMIN WITH MINERALS) TABS tablet Take 1 tablet by mouth daily.    Marland Kitchen omeprazole (PRILOSEC) 20 MG capsule Take 1 capsule (20 mg total) by mouth daily as needed. 90 capsule 3  . spironolactone (ALDACTONE) 50 MG tablet Take 1 tablet (50 mg  total) by mouth at bedtime. 90 tablet 3   No current facility-administered medications for this visit.    Allergies:   Patient has no known allergies.    Social History:  The patient  reports that she has never smoked. She has never used smokeless tobacco. She reports current alcohol use of about 7.0 standard drinks of alcohol per week.   Family History:  The patient's family history includes Breast cancer in her mother; Heart disease in her father; Hypertension in her father and mother.    ROS:  Please see the history of present illness.   Otherwise, review of systems are positive for none.   All other systems are reviewed and negative.    PHYSICAL EXAM: VS:  BP (!) 150/88   Pulse 65   Temp (!) 97 F (36.1 C)   Ht 5\' 1"  (1.549 m)   Wt 168 lb 12.8 oz (76.6 kg)   SpO2 98%   BMI 31.89 kg/m  , BMI Body mass index is 31.89 kg/m. GENERAL:  Well appearing HEENT:  Pupils equal round and reactive, fundi not visualized, oral mucosa unremarkable NECK:  No jugular venous distention, waveform within normal limits, carotid upstroke brisk and symmetric, no bruits, no thyromegaly LYMPHATICS:  No cervical, inguinal adenopathy LUNGS:  Clear to auscultation bilaterally BACK:  No CVA tenderness CHEST:  Unremarkable HEART:  PMI not displaced or sustained,S1 and S2 within normal limits, no S3, no S4, no clicks, no rubs, no murmurs ABD:  Flat, positive bowel sounds normal in frequency in pitch, no bruits, no rebound, no guarding, no midline pulsatile mass, no hepatomegaly, no splenomegaly EXT:  2 plus pulses throughout, no edema, no cyanosis no clubbing SKIN:  No rashes no nodules NEURO:  Cranial nerves II through XII grossly intact,  motor grossly intact throughout PSYCH:  Cognitively intact, oriented to person place and time    EKG:  EKG is ordered today. The ekg ordered today demonstrates sinus rhythm, rate 94, intervals within normal limits, no acute ST-T wave changes.   Recent  Labs: 12/06/2019: Hemoglobin 11.9; Platelets 216 03/05/2020: BUN 31; Creatinine, Ser 1.09; Potassium 4.4; Sodium 140; TSH 3.180    Lipid Panel    Component Value Date/Time   CHOL 142 03/09/2018 1655   TRIG 88 03/09/2018 1655   HDL 70 03/09/2018 1655   CHOLHDL 2.0 03/09/2018 1655   CHOLHDL 2.2 10/09/2015 1418   VLDL 22 10/09/2015 1418   LDLCALC 54 03/09/2018 1655      Wt Readings from Last 3 Encounters:  04/19/20 168 lb 12.8 oz (76.6 kg)  03/22/20 169 lb (76.7 kg)  02/17/20 176 lb (79.8 kg)      Other studies Reviewed: Additional studies/ records that were reviewed today include: Labs. Review of the above records demonstrates:  Please see elsewhere in the note.     ASSESSMENT AND PLAN:  HTN:    Patient has hypertension which is difficult to control.  We discussed this at length.  I do agree with starting spironolactone 50 mg daily.  She will come back in about 2 weeks.  TSH and a basic metabolic profile.  She can use her hydralazine more frequently if she is having significantly elevated pressures as we see today.  She will check this again tonight.  We actually gave her a blood pressure cuff and reviewed how to use this so that she can keep a blood pressure diary.  She will need unsure multiple adjustments to her medications with her multiple medications for control.  LVH: She has had evidence of hypertensive heart disease and we will follow this with repeat echo is going forward.  COVID EDUCATION: She has been vaccinated.  Current medicines are reviewed at length with the patient today.  The patient does not have concerns regarding medicines.  The following changes have been made:  no change  Labs/ tests ordered today include:   No orders of the defined types were placed in this encounter.    Disposition:   FU with HTN Clinic in one month and me in two months.     Signed, Pamela Breeding, MD  04/19/2020 5:54 PM    [place]

## 2020-04-19 ENCOUNTER — Encounter: Payer: Self-pay | Admitting: Cardiology

## 2020-04-19 ENCOUNTER — Ambulatory Visit (INDEPENDENT_AMBULATORY_CARE_PROVIDER_SITE_OTHER): Payer: PRIVATE HEALTH INSURANCE | Admitting: Cardiology

## 2020-04-19 ENCOUNTER — Other Ambulatory Visit: Payer: Self-pay

## 2020-04-19 VITALS — BP 150/88 | HR 65 | Temp 97.0°F | Ht 61.0 in | Wt 168.8 lb

## 2020-04-19 DIAGNOSIS — I517 Cardiomegaly: Secondary | ICD-10-CM

## 2020-04-19 DIAGNOSIS — Z7189 Other specified counseling: Secondary | ICD-10-CM

## 2020-04-19 DIAGNOSIS — I10 Essential (primary) hypertension: Secondary | ICD-10-CM

## 2020-04-19 NOTE — Patient Instructions (Signed)
Medication Instructions:  °NO CHANGES °*If you need a refill on your cardiac medications before your next appointment, please call your pharmacy* ° °Lab Work: °NONE ORDERED THIS VISIT ° °Testing/Procedures: °NONE ORDERED THIS VISIT ° °Follow-Up: °At [facility], you and your health needs are our priority.  As part of our continuing mission to provide you with exceptional heart care, we have created designated Provider Care Teams.  These Care Teams include your primary Cardiologist (physician) and Advanced Practice Providers (APPs -  Physician Assistants and Nurse Practitioners) who all work together to provide you with the care you need, when you need it. ° °Your next appointment:   °6 month(s)  You will receive a reminder letter in the mail two months in advance. If you don't receive a letter, please call our office to schedule the follow-up appointment. ° °The format for your next appointment:   °In Person ° °Provider:   °James Hochrein, MD ° ° °

## 2020-04-26 ENCOUNTER — Other Ambulatory Visit: Payer: Self-pay | Admitting: Family Medicine

## 2020-04-26 ENCOUNTER — Other Ambulatory Visit: Payer: Self-pay | Admitting: Cardiology

## 2020-04-26 DIAGNOSIS — I10 Essential (primary) hypertension: Secondary | ICD-10-CM

## 2020-06-01 IMAGING — MG DIGITAL SCREENING BILATERAL MAMMOGRAM WITH TOMO AND CAD
8 series · 8 of 24 positions shown · non-contrast
Comparison: Previous exam(s).

CLINICAL DATA: Screening.

EXAM:
DIGITAL SCREENING BILATERAL MAMMOGRAM WITH TOMO AND CAD

[L MLO synth-2D]
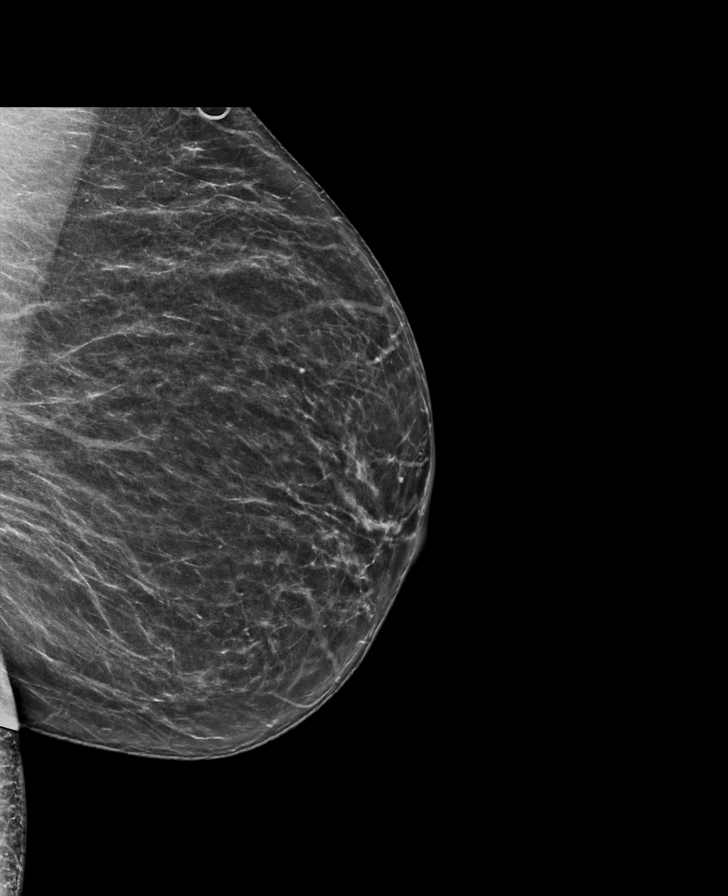

[L CC synth-2D]
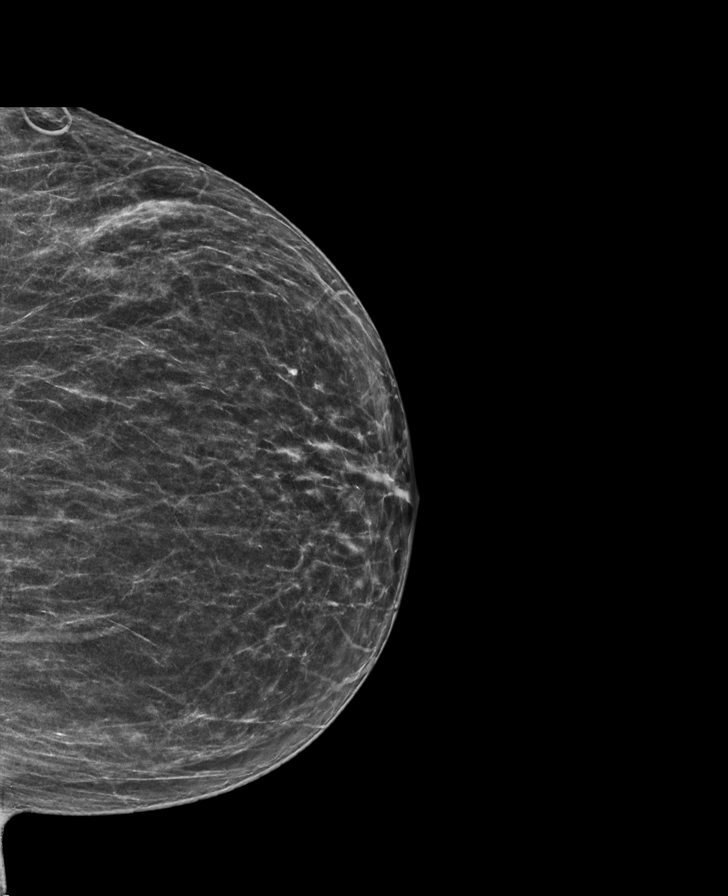

[R MLO synth-2D]
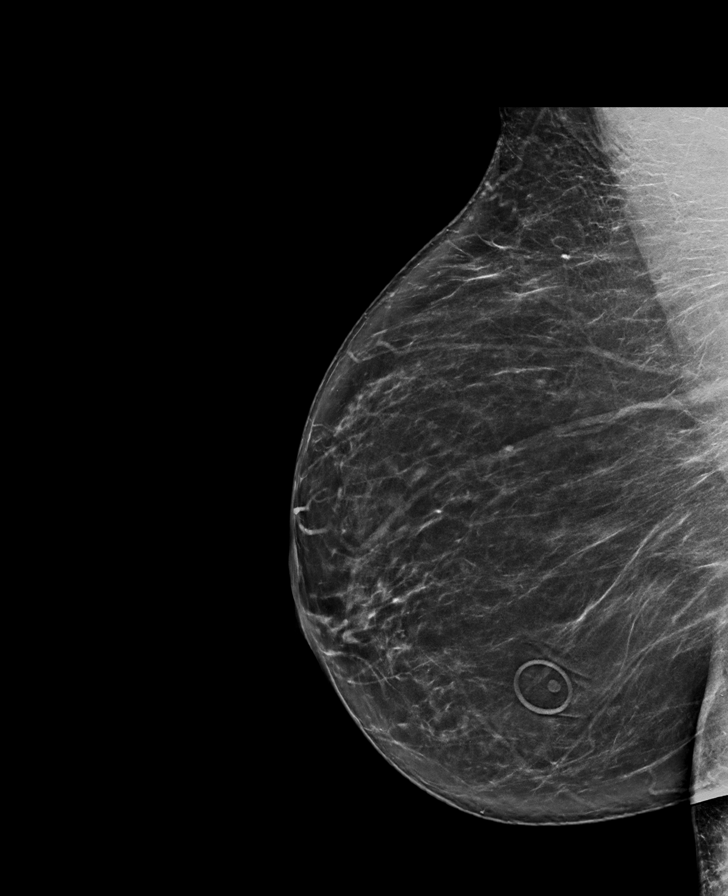

[R CC synth-2D]
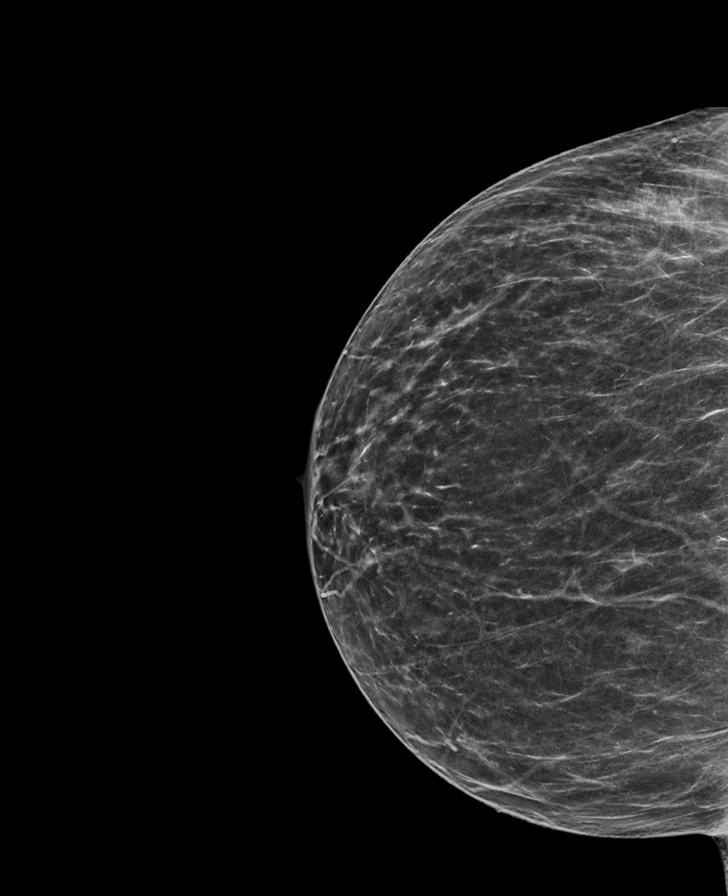

[L CC tomo · tomo slice 33/66.0]
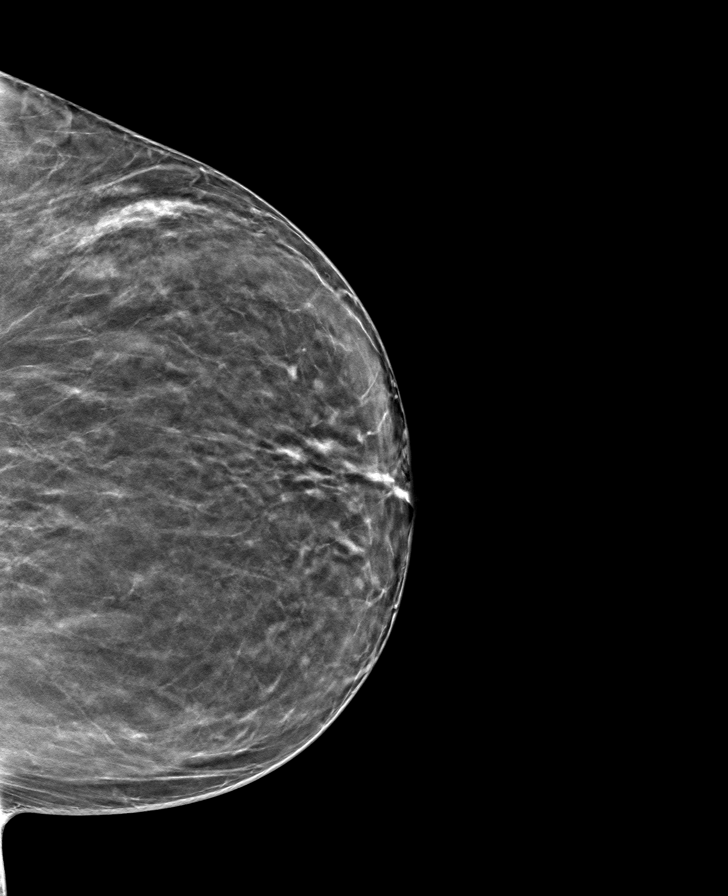

[R CC tomo · tomo slice 35/69.0]
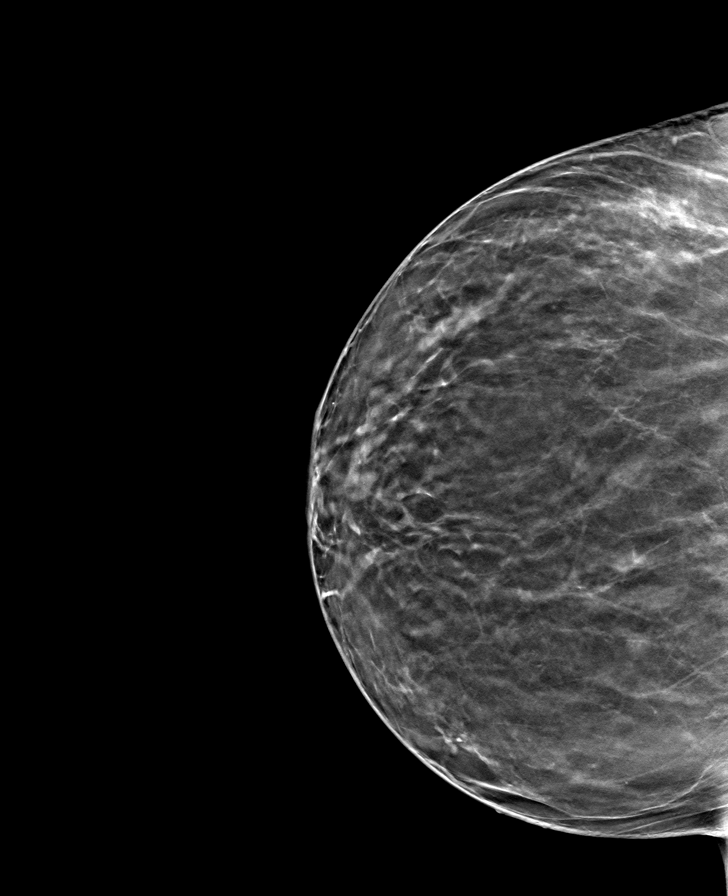

[R MLO tomo · tomo slice 43/86.0]
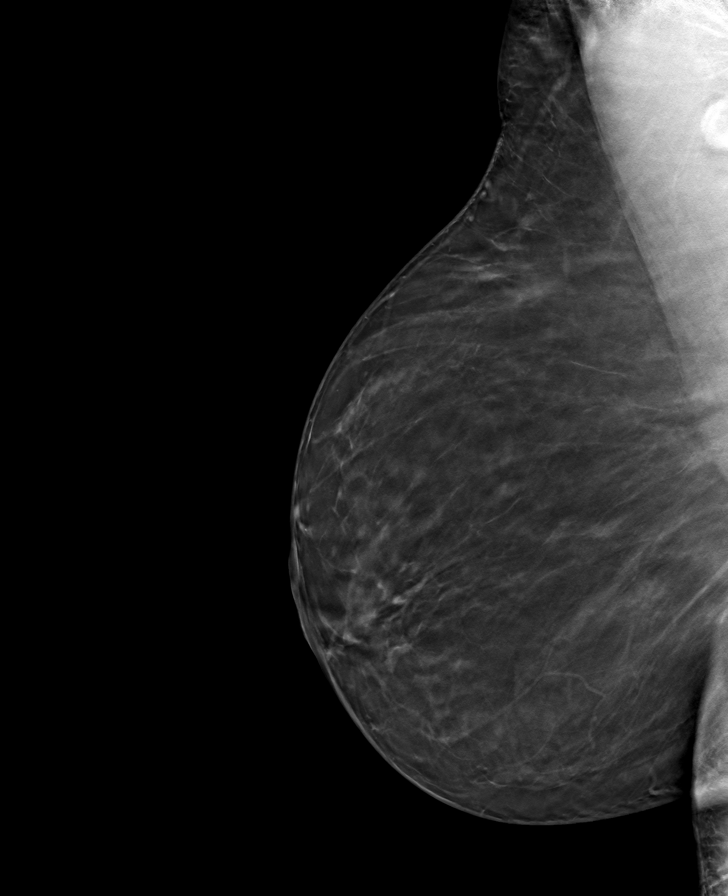

[L MLO tomo · tomo slice 37/73.0]
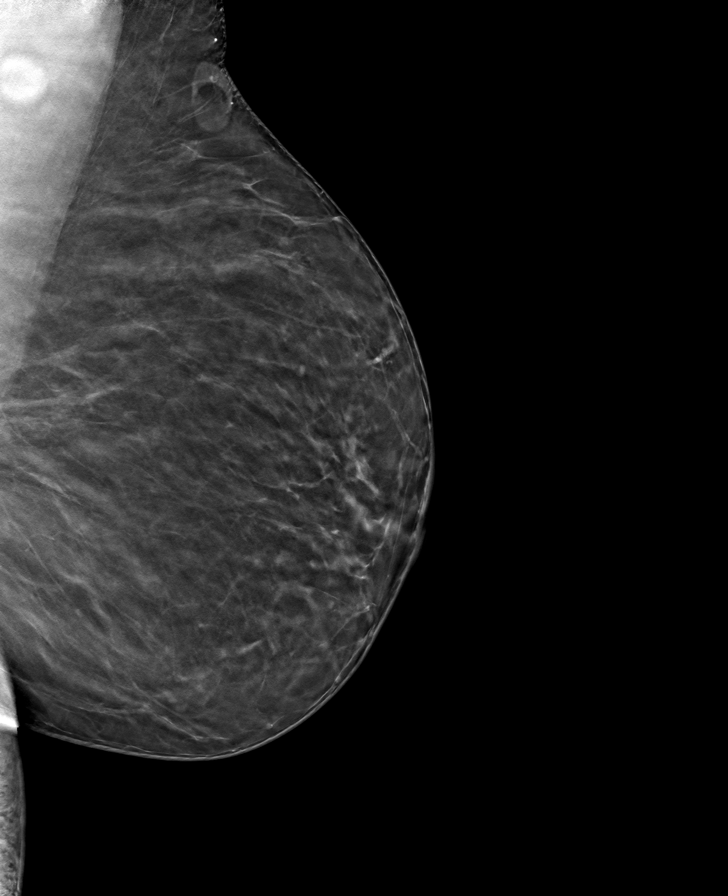

[8 of 24 positions shown; findings below may reference images not displayed]

ACR Breast Density Category b: There are scattered areas of
fibroglandular density.
FINDINGS: There are no findings suspicious for malignancy. Images were
processed with CAD.
IMPRESSION: No mammographic evidence of malignancy. A result letter of this
screening mammogram will be mailed directly to the patient.

RECOMMENDATION:
Screening mammogram in one year. (Code:[id number])

BI-RADS CATEGORY  1: Negative.

## 2020-11-25 NOTE — Progress Notes (Signed)
Cardiology Office Note   Date:  11/27/2020   ID:  Pamela Schneider, DOB 1971-10-30, MRN [id number]  PCP:  Patriciaann Clan, DO  Cardiologist:   No primary care provider on file. Referring:  Patriciaann Clan, DO  Chief Complaint  Patient presents with  . Left Ventricular Hypertrophy      History of Present Illness: Pamela Schneider is a 50 y.o. female who is referred by Patriciaann Clan, DO for evaluation of difficult to control hypertension.  She has very strong family history of hypertension.  She has not always been particular compliant with medications until more recently.  She has had a work-up to include negative renal Dopplers. She had an echo in March and she had an EF of 55 - 60%.  She has moderate concentric hypertrophy.  There is been no clear secondary cause.    Since I last saw her she has done well.  She has a physical job. The patient denies any new symptoms such as chest discomfort, neck or arm discomfort. There has been no new shortness of breath, PND or orthopnea. There have been no reported palpitations, presyncope or syncope.  She did have Covid but she says she was not particularly symptomatic with this.  Despite the fact that we gave her a blood pressure, the last appointment she has not been checking her blood pressure.   Past Medical History:  Diagnosis Date  . CHEST PAIN, ATYPICAL 08/08/2010   Qualifier: Diagnosis of  By: Loraine Maple MD, Jacquelyn    . History of anemia 03/12/2018  . Hypertension     Past Surgical History:  Procedure Laterality Date  . TUBAL LIGATION       Current Outpatient Medications  Medication Sig Dispense Refill  . diclofenac sodium (VOLTAREN) 1 % GEL Apply 2 g topically 4 (four) times daily. 100 g 3  . ferrous sulfate 324 (65 Fe) MG TBEC Take 1 tablet (325 mg total) by mouth daily. 30 tablet 5  . fluticasone (FLONASE) 50 MCG/ACT nasal spray Place 1 spray into both nostrils daily. 1 spray in each nostril every day 16 g 5  .  indapamide (LOZOL) 2.5 MG tablet Take 1 tablet (2.5 mg total) by mouth daily. 90 tablet 2  . Multiple Vitamin (MULTIVITAMIN WITH MINERALS) TABS tablet Take 1 tablet by mouth daily.    Marland Kitchen omeprazole (PRILOSEC) 20 MG capsule Take 1 capsule (20 mg total) by mouth daily as needed. 90 capsule 3  . candesartan (ATACAND) 32 MG tablet Take 1 tablet (32 mg total) by mouth at bedtime. 30 tablet 11  . hydrALAZINE (APRESOLINE) 25 MG tablet Take 1 tablet (25 mg total) by mouth 2 (two) times daily. 180 tablet 3  . spironolactone (ALDACTONE) 50 MG tablet Take 1 tablet (50 mg total) by mouth at bedtime. 90 tablet 3   No current facility-administered medications for this visit.    Allergies:   Patient has no known allergies.    ROS:  Please see the history of present illness.   Otherwise, review of systems are positive for none.   All other systems are reviewed and negative.    PHYSICAL EXAM: VS:  BP 140/90   Pulse (!) 103   Ht 5\' 1"  (1.549 m)   Wt 170 lb 6.4 oz (77.3 kg)   BMI 32.20 kg/m  , BMI Body mass index is 32.2 kg/m. GENERAL:  Well appearing NECK:  No jugular venous distention, waveform within normal limits, carotid upstroke brisk  and symmetric, no bruits, no thyromegaly LUNGS:  Clear to auscultation bilaterally CHEST:  Unremarkable HEART:  PMI not displaced or sustained,S1 and S2 within normal limits, no S3, no S4, no clicks, no rubs, no murmurs ABD:  Flat, positive bowel sounds normal in frequency in pitch, no bruits, no rebound, no guarding, no midline pulsatile mass, no hepatomegaly, no splenomegaly EXT:  2 plus pulses throughout, no edema, no cyanosis no clubbing   EKG:  EKG is  ordered today. Sinus rhythm, rate 103, axis within normal limits, intervals within normal limits, nonspecific ST-T wave changes.  Recent Labs: 12/06/2019: Hemoglobin 11.9; Platelets 216 03/05/2020: BUN 31; Creatinine, Ser 1.09; Potassium 4.4; Sodium 140; TSH 3.180    Lipid Panel    Component Value Date/Time    CHOL 142 03/09/2018 1655   TRIG 88 03/09/2018 1655   HDL 70 03/09/2018 1655   CHOLHDL 2.0 03/09/2018 1655   CHOLHDL 2.2 10/09/2015 1418   VLDL 22 10/09/2015 1418   LDLCALC 54 03/09/2018 1655      Wt Readings from Last 3 Encounters:  11/27/20 170 lb 6.4 oz (77.3 kg)  04/19/20 168 lb 12.8 oz (76.6 kg)  03/22/20 169 lb (76.7 kg)      Other studies Reviewed: Additional studies/ records that were reviewed today include: None. Review of the above records demonstrates:  NA  ASSESSMENT AND PLAN:  HTN:   The blood pressure is at target. No change in medications is indicated. We will continue with therapeutic lifestyle changes (TLC).  I am not going to titrate her meds at this point I would like for her to keep a blood pressure diary and we might need to go upward.  Her blood pressure is much better than it had been.  LVH: I will repeat an echocardiogram in 1 year.   Current medicines are reviewed at length with the patient today.  The patient does not have concerns regarding medicines.  The following changes have been made:  None  Labs/ tests ordered today include:   Orders Placed This Encounter  Procedures  . EKG 12-Lead  . ECHOCARDIOGRAM COMPLETE     Disposition:   FU with me in one year.    Signed, Minus Breeding, MD  11/27/2020 5:28 PM    [facility]

## 2020-11-27 ENCOUNTER — Other Ambulatory Visit: Payer: Self-pay

## 2020-11-27 ENCOUNTER — Ambulatory Visit (INDEPENDENT_AMBULATORY_CARE_PROVIDER_SITE_OTHER): Payer: PRIVATE HEALTH INSURANCE | Admitting: Cardiology

## 2020-11-27 ENCOUNTER — Encounter: Payer: Self-pay | Admitting: Cardiology

## 2020-11-27 VITALS — BP 140/90 | HR 103 | Ht 61.0 in | Wt 170.4 lb

## 2020-11-27 DIAGNOSIS — I517 Cardiomegaly: Secondary | ICD-10-CM

## 2020-11-27 DIAGNOSIS — I10 Essential (primary) hypertension: Secondary | ICD-10-CM

## 2020-11-27 MED ORDER — CANDESARTAN CILEXETIL 32 MG PO TABS: 32.0000 mg | ORAL_TABLET | Freq: Every day | ORAL | 11 refills | Status: DC

## 2020-11-27 MED ORDER — SPIRONOLACTONE 50 MG PO TABS: 50.0000 mg | ORAL_TABLET | Freq: Every day | ORAL | 3 refills | Status: DC

## 2020-11-27 MED ORDER — HYDRALAZINE HCL 25 MG PO TABS: 25.0000 mg | ORAL_TABLET | Freq: Two times a day (BID) | ORAL | 3 refills | Status: DC

## 2020-11-27 NOTE — Patient Instructions (Signed)
Medication Instructions:  No changes *If you need a refill on your cardiac medications before your next appointment, please call your pharmacy*  Testing/Procedures: Your physician has requested that you have an echocardiogram in January 2023. Echocardiography is a painless test that uses sound waves to create images of your heart. It provides your doctor with information about the size and shape of your heart and how well your heart's chambers and valves are working. This procedure takes approximately one hour. There are no restrictions for this procedure.  Follow-Up: At [facility], you and your health needs are our priority.  As part of our continuing mission to provide you with exceptional heart care, we have created designated Provider Care Teams.  These Care Teams include your primary Cardiologist (physician) and Advanced Practice Providers (APPs -  Physician Assistants and Nurse Practitioners) who all work together to provide you with the care you need, when you need it.    Your next appointment:   1 year(s) after echo  The format for your next appointment:   In Person  Provider:   Minus Breeding, MD

## 2021-01-05 ENCOUNTER — Other Ambulatory Visit: Payer: Self-pay | Admitting: Family Medicine

## 2021-01-05 DIAGNOSIS — I10 Essential (primary) hypertension: Secondary | ICD-10-CM

## 2021-02-18 ENCOUNTER — Other Ambulatory Visit: Payer: Self-pay | Admitting: Family Medicine

## 2021-02-18 DIAGNOSIS — Z1231 Encounter for screening mammogram for malignant neoplasm of breast: Secondary | ICD-10-CM

## 2021-02-26 ENCOUNTER — Telehealth: Payer: Self-pay

## 2021-02-26 ENCOUNTER — Other Ambulatory Visit (HOSPITAL_COMMUNITY)
Admission: RE | Admit: 2021-02-26 | Discharge: 2021-02-26 | Disposition: A | Discharge status: Home / Self Care | Payer: PRIVATE HEALTH INSURANCE | Source: Ambulatory Visit | Attending: Family Medicine | Admitting: Family Medicine

## 2021-02-26 ENCOUNTER — Other Ambulatory Visit: Payer: Self-pay

## 2021-02-26 ENCOUNTER — Encounter: Payer: Self-pay | Admitting: Family Medicine

## 2021-02-26 ENCOUNTER — Ambulatory Visit: Payer: PRIVATE HEALTH INSURANCE | Admitting: Family Medicine

## 2021-02-26 ENCOUNTER — Ambulatory Visit (INDEPENDENT_AMBULATORY_CARE_PROVIDER_SITE_OTHER): Payer: PRIVATE HEALTH INSURANCE | Admitting: Family Medicine

## 2021-02-26 VITALS — BP 146/70 | HR 78 | Ht 61.0 in | Wt 171.2 lb

## 2021-02-26 VITALS — BP 138/78 | HR 77 | Ht 61.0 in | Wt 171.0 lb

## 2021-02-26 DIAGNOSIS — Z1159 Encounter for screening for other viral diseases: Secondary | ICD-10-CM

## 2021-02-26 DIAGNOSIS — Z01419 Encounter for gynecological examination (general) (routine) without abnormal findings: Secondary | ICD-10-CM

## 2021-02-26 DIAGNOSIS — Z0000 Encounter for general adult medical examination without abnormal findings: Secondary | ICD-10-CM | POA: Diagnosis not present

## 2021-02-26 DIAGNOSIS — I10 Essential (primary) hypertension: Secondary | ICD-10-CM | POA: Diagnosis not present

## 2021-02-26 DIAGNOSIS — I1A0 Resistant hypertension: Secondary | ICD-10-CM

## 2021-02-26 DIAGNOSIS — Z862 Personal history of diseases of the blood and blood-forming organs and certain disorders involving the immune mechanism: Secondary | ICD-10-CM

## 2021-02-26 DIAGNOSIS — E049 Nontoxic goiter, unspecified: Secondary | ICD-10-CM | POA: Diagnosis not present

## 2021-02-26 DIAGNOSIS — Z789 Other specified health status: Secondary | ICD-10-CM

## 2021-02-26 DIAGNOSIS — Z1211 Encounter for screening for malignant neoplasm of colon: Secondary | ICD-10-CM

## 2021-02-26 DIAGNOSIS — D509 Iron deficiency anemia, unspecified: Secondary | ICD-10-CM

## 2021-02-26 NOTE — Progress Notes (Signed)
    SUBJECTIVE:   Chief compliant/HPI: annual examination  Shaima C Kohn is a 50 y.o. who presents today for an annual exam.   She reports that she has been doing well.  Taking her hypertension medications as prescribed.  Health maintenance due: Hepatitis C screening, colonoscopy, Pap smear.  She does not desire STD screening today.  Her mother was diagnosed with breast cancer in 2018, she is not sure which kind.  No other family history of breast cancer or colon cancer.  She states she recently saw her cardiologist and dentist, both of which mention some concern with the size of her thyroid and recommended discussing with her PCP.  History tabs reviewed and updated yes: history of enlarged uterus with fibroids, resistant hypertension, previous anemia, GERD.  Review of systems form reviewed and notable for no chest pain, shortness of breath, palpitations, unexpected weight loss/gain.  OBJECTIVE:   BP (!) 146/70   Pulse 78   Ht $R'5\' 1"'yn$  (1.549 m)   Wt 171 lb 3.2 oz (77.7 kg)   LMP 01/28/2021 (Exact Date)   SpO2 97%   BMI 32.35 kg/m   General: Alert, NAD HEENT: NCAT, MMM, oropharynx nonerythematous  Cardiac: RRR Lungs: Clear bilaterally, no increased WOB  Abdomen: soft Msk: Moves all extremities spontaneously  Ext: Warm, dry, 2+ distal pulses, no edema  Pelvic exam: VULVA: normal appearing vulva with no masses, tenderness or lesions, VAGINA: normal appearing vagina with normal color and discharge, no lesions, vaginal discharge - brown discharge, CERVIX: normal appearing cervix without discharge or lesions, UTERUS: enlarged to 20 week's size, ADNEXA: unclear if palpated due to size of uterus, non-tender   ASSESSMENT/PLAN:   Annual physical exam See AVS for age appropriate recommendations  PHQ score 0, reviewed and discussed.  BP reviewed and elevated today, however has been reasonable control at home (SBP 130's).   Considered the following items based upon USPSTF  recommendations: Diabetes screening: monitor glucose via BMP  Screening for elevated cholesterol: ordered HIV testing: declined  Hepatitis C: ordered Syphilis if at high risk: Not high risk, declined  GC/CT not at high risk and not ordered. Reviewed risk factors for latent tuberculosis and not indicated  Discussed family history, BRCA testing not indicated.  Cervical cancer screening: due for Pap today, cytology + HPV ordered Breast cancer screening: scheduled for 04/2021 Colorectal cancer screening: discussed, colonoscopy ordered   Resistant hypertension Elevated today, however has been reasonable control at home.  Follows with cardiology.  Continue candesartan, hydralazine, indapamide, and spironolactone as it is.  Obtain BMP and lipid panel.  Enlarged thyroid Reports concern from her additional providers due to size.  Mild symmetrical enlargement with possible nodule on the left.  Will obtain thyroid U/S and TSH/T4.  Asymptomatic and nontender.  History of anemia Check CBC.     Follow up in 6 months or sooner if indicated.    Patriciaann Clan, [place]

## 2021-02-26 NOTE — Assessment & Plan Note (Signed)
See AVS for age appropriate recommendations  PHQ score 0, reviewed and discussed.  BP reviewed and elevated today, however has been reasonable control at home (SBP 130's).   Considered the following items based upon USPSTF recommendations: Diabetes screening: monitor glucose via BMP  Screening for elevated cholesterol: ordered HIV testing: declined  Hepatitis C: ordered Syphilis if at high risk: Not high risk, declined  GC/CT not at high risk and not ordered. Reviewed risk factors for latent tuberculosis and not indicated  Discussed family history, BRCA testing not indicated.  Cervical cancer screening: due for Pap today, cytology + HPV ordered Breast cancer screening: scheduled for 04/2021 Colorectal cancer screening: discussed, colonoscopy ordered

## 2021-02-26 NOTE — Assessment & Plan Note (Addendum)
Elevated today, however has been reasonable control at home.  Follows with cardiology.  Continue candesartan, hydralazine, indapamide, and spironolactone as it is.  Obtain BMP and lipid panel.

## 2021-02-26 NOTE — Assessment & Plan Note (Signed)
Check CBC 

## 2021-02-26 NOTE — Patient Instructions (Signed)
It was wonderful to see you today.  I have placed a referral to GI for your screening colonoscopy, this can be scheduled for the next several months when you are available.  Additionally we will be collecting labs today, I will give you a call or send you a MyChart message in the next several days with these results.  Please make sure you try to stay physically active as you can, recommend about 30 minutes 5 days a week.  Every little bit counts throughout the day.

## 2021-02-26 NOTE — Progress Notes (Signed)
Subjective:     Patient ID: Pamela Schneider, female   DOB: [birth year], 50 y.o.   MRN: [id number]  HPI [facility] her required wellness visit for her insurance. She saw her PCP earlier today for her annual physical, pap smear and lab work were completed. She has her mammogram scheduled for June and will get colonoscopy scheduled this year as well. She is working on increasing her physical activity as well as eating a healthier diet. She will start checking her BP at home. She denies any current problems or concerns.  Past Medical History:  Diagnosis Date  . CHEST PAIN, ATYPICAL 08/08/2010   Qualifier: Diagnosis of  By: Loraine Maple MD, Jacquelyn    . Essential hypertension 12/31/2006   Qualifier: Diagnosis of  By: Beryle Lathe    . History of anemia 03/12/2018  . Hypertension    No Known Allergies  Current Outpatient Medications:  .  candesartan (ATACAND) 32 MG tablet, Take 1 tablet (32 mg total) by mouth at bedtime., Disp: 30 tablet, Rfl: 11 .  fluticasone (FLONASE) 50 MCG/ACT nasal spray, Place 1 spray into both nostrils daily. 1 spray in each nostril every day, Disp: 16 g, Rfl: 5 .  hydrALAZINE (APRESOLINE) 25 MG tablet, Take 1 tablet (25 mg total) by mouth 2 (two) times daily., Disp: 180 tablet, Rfl: 3 .  indapamide (LOZOL) 2.5 MG tablet, TAKE 1 TABLET BY MOUTH EVERY DAY, Disp: 90 tablet, Rfl: 2 .  Multiple Vitamin (MULTIVITAMIN WITH MINERALS) TABS tablet, Take 1 tablet by mouth daily., Disp: , Rfl:  .  omeprazole (PRILOSEC) 20 MG capsule, Take 1 capsule (20 mg total) by mouth daily as needed., Disp: 90 capsule, Rfl: 3 .  spironolactone (ALDACTONE) 50 MG tablet, Take 1 tablet (50 mg total) by mouth at bedtime., Disp: 90 tablet, Rfl: 3 .  diclofenac sodium (VOLTAREN) 1 % GEL, Apply 2 g topically 4 (four) times daily. (Patient not taking: Reported on 02/26/2021), Disp: 100 g, Rfl: 3 .  ferrous sulfate 324 (65 Fe) MG TBEC, Take 1 tablet (325 mg  total) by mouth daily. (Patient not taking: Reported on 02/26/2021), Disp: 30 tablet, Rfl: 5   Review of Systems  Constitutional: Negative for chills, fatigue, fever and unexpected weight change.  HENT: Negative for congestion, ear pain, sinus pressure, sinus pain and sore throat.   Eyes: Negative for discharge and visual disturbance.  Respiratory: Negative for cough, shortness of breath and wheezing.   Cardiovascular: Negative for chest pain and leg swelling.  Gastrointestinal: Negative for abdominal pain, blood in stool, constipation, diarrhea, nausea and vomiting.  Genitourinary: Negative for difficulty urinating and hematuria.  Skin: Negative for color change.  Neurological: Negative for dizziness, weakness, light-headedness and headaches.  Hematological: Negative for adenopathy.  All other systems reviewed and are negative.      Objective:   Physical Exam Vitals reviewed.  Constitutional:      General: She is not in acute distress.    Appearance: Normal appearance. She is well-developed.  HENT:     Head: Normocephalic and atraumatic.  Eyes:     General:        Right eye: No discharge.        Left eye: No discharge.  Cardiovascular:     Rate and Rhythm: Normal rate and regular rhythm.     Heart sounds: Normal heart sounds.  Pulmonary:     Effort: Pulmonary effort is normal. No respiratory distress.  Breath sounds: Normal breath sounds.  Musculoskeletal:     Cervical back: Neck supple.  Skin:    General: Skin is warm and dry.  Neurological:     Mental Status: She is alert and oriented to person, place, and time.  Psychiatric:        Mood and Affect: Mood normal.        Behavior: Behavior normal.    Today's Vitals   02/26/21 1022  BP: 138/78  Pulse: 77  SpO2: 100%  Weight: 171 lb (77.6 kg)  Height: 5\' 1"  (1.549 m)   Body mass index is 32.31 kg/m.     Assessment:     Participant in health and wellness plan      Plan:     1. Keep all regular appt  with PCP and specialists. Recommend keeping a log of her BP at home to take to her next cardiology or PCP visit. Encouraged continued efforts at increasing physical activity and healthy eating. F/u here prn.

## 2021-02-26 NOTE — Telephone Encounter (Signed)
Called LVM for pt to call. If pt calls please give her the following info:  Appointment info for  Thyroid US:  [place] [facility] [phone] Press 1 for scheduling Press 5 for [place]  Appt: wed May 4th @3 :45. If patient needs to reschedule please do so 24 hours ahead of time so she is not charged $75 for not showing up.

## 2021-02-26 NOTE — Assessment & Plan Note (Signed)
Reports concern from her additional providers due to size.  Mild symmetrical enlargement with possible nodule on the left.  Will obtain thyroid U/S and TSH/T4.  Asymptomatic and nontender.

## 2021-02-27 LAB — CBC WITH DIFFERENTIAL/PLATELET
Basophils Absolute: 0 10*3/uL (ref 0.0–0.2)
Basos: 0 %
EOS (ABSOLUTE): 0.2 10*3/uL (ref 0.0–0.4)
Eos: 6 %
Hematocrit: 36.4 % (ref 34.0–46.6)
Hemoglobin: 11.2 g/dL (ref 11.1–15.9)
Immature Grans (Abs): 0 10*3/uL (ref 0.0–0.1)
Immature Granulocytes: 0 %
Lymphocytes Absolute: 1.1 10*3/uL (ref 0.7–3.1)
Lymphs: 33 %
MCH: 22.3 pg — ABNORMAL LOW (ref 26.6–33.0)
MCHC: 30.8 g/dL — ABNORMAL LOW (ref 31.5–35.7)
MCV: 72 fL — ABNORMAL LOW (ref 79–97)
Monocytes Absolute: 0.2 10*3/uL (ref 0.1–0.9)
Monocytes: 7 %
Neutrophils Absolute: 1.9 10*3/uL (ref 1.4–7.0)
Neutrophils: 54 %
Platelets: 221 10*3/uL (ref 150–450)
RBC: 5.03 x10E6/uL (ref 3.77–5.28)
RDW: 15 % (ref 11.7–15.4)
WBC: 3.4 10*3/uL (ref 3.4–10.8)

## 2021-02-27 LAB — BASIC METABOLIC PANEL
BUN/Creatinine Ratio: 26 — ABNORMAL HIGH (ref 9–23)
BUN: 28 mg/dL — ABNORMAL HIGH (ref 6–24)
CO2: 25 mmol/L (ref 20–29)
Calcium: 9.7 mg/dL (ref 8.7–10.2)
Chloride: 98 mmol/L (ref 96–106)
Creatinine, Ser: 1.07 mg/dL — ABNORMAL HIGH (ref 0.57–1.00)
Glucose: 99 mg/dL (ref 65–99)
Potassium: 4 mmol/L (ref 3.5–5.2)
Sodium: 137 mmol/L (ref 134–144)
eGFR: 63 mL/min/{1.73_m2} (ref >59)

## 2021-02-27 LAB — LIPID PANEL
Chol/HDL Ratio: 1.8 ratio (ref 0.0–4.4)
Cholesterol, Total: 142 mg/dL (ref 100–199)
HDL: 78 mg/dL (ref >39)
LDL Chol Calc (NIH): 51 mg/dL (ref 0–99)
Triglycerides: 65 mg/dL (ref 0–149)
VLDL Cholesterol Cal: 13 mg/dL (ref 5–40)

## 2021-02-27 LAB — TSH+FREE T4
Free T4: 1.31 ng/dL (ref 0.82–1.77)
TSH: 2.35 u[IU]/mL (ref 0.450–4.500)

## 2021-02-27 LAB — HEPATITIS C ANTIBODY: Hep C Virus Ab: <0.1 s/co ratio (ref 0.0–0.9)

## 2021-02-28 LAB — CYTOLOGY - PAP
Comment: NEGATIVE
Diagnosis: NEGATIVE
High risk HPV: NEGATIVE

## 2021-02-28 NOTE — Telephone Encounter (Signed)
Patient LVM returning call to nurse line. I attempted to call her back to give the [place] date and time information, however no answer.

## 2021-03-04 NOTE — Telephone Encounter (Signed)
Patient returns my call. Apt information given and phone number in case she needs to reschedule.

## 2021-03-06 ENCOUNTER — Other Ambulatory Visit: Payer: PRIVATE HEALTH INSURANCE

## 2021-03-09 ENCOUNTER — Other Ambulatory Visit: Payer: Self-pay | Admitting: Family Medicine

## 2021-03-09 DIAGNOSIS — K2100 Gastro-esophageal reflux disease with esophagitis, without bleeding: Secondary | ICD-10-CM

## 2021-03-12 ENCOUNTER — Ambulatory Visit
Admission: RE | Admit: 2021-03-12 | Discharge: 2021-03-12 | Disposition: A | Discharge status: Home / Self Care | Payer: PRIVATE HEALTH INSURANCE | Source: Ambulatory Visit | Attending: Family Medicine | Admitting: Family Medicine

## 2021-03-12 DIAGNOSIS — E049 Nontoxic goiter, unspecified: Secondary | ICD-10-CM

## 2021-03-20 ENCOUNTER — Other Ambulatory Visit: Payer: Self-pay | Admitting: Cardiology

## 2021-04-11 ENCOUNTER — Ambulatory Visit
Admission: RE | Admit: 2021-04-11 | Discharge: 2021-04-11 | Disposition: A | Discharge status: Home / Self Care | Payer: PRIVATE HEALTH INSURANCE | Source: Ambulatory Visit | Attending: Family Medicine | Admitting: Family Medicine

## 2021-04-11 ENCOUNTER — Other Ambulatory Visit: Payer: Self-pay

## 2021-04-11 DIAGNOSIS — Z1231 Encounter for screening mammogram for malignant neoplasm of breast: Secondary | ICD-10-CM

## 2021-05-08 ENCOUNTER — Telehealth: Payer: Self-pay

## 2021-05-08 NOTE — Telephone Encounter (Signed)
Patient LVM on nurse line requesting a prescription for motion sickness. Patient reports she is going on a cruise. Will forward to PCP.

## 2021-05-09 ENCOUNTER — Other Ambulatory Visit: Payer: Self-pay | Admitting: Family Medicine

## 2021-05-09 MED ORDER — DIMENHYDRINATE 50 MG PO TABS: 50.0000 mg | ORAL_TABLET | Freq: Three times a day (TID) | ORAL | 0 refills | Status: DC | PRN

## 2021-05-09 NOTE — Telephone Encounter (Signed)
42 tabs of dramamine ordered, 0 refills.   Eulis Foster, MD [place], PGY-3 [phone]

## 2021-05-27 ENCOUNTER — Ambulatory Visit: Payer: PRIVATE HEALTH INSURANCE

## 2021-06-10 NOTE — Progress Notes (Signed)
    SUBJECTIVE:   CHIEF COMPLAINT / HPI: right heel pain   Right heel pain Has been present for 1 month. She reports that the pain loosens up during the day as she continues to walk throughout the day. She reports that it eases up throughout the day. The pain is located in her heel. She reports that pain is manageable and rates it as a 5/10. She sometimes tries to walk. She denies prior hx of foot fracture. She denies any recent trauma to the foot. She has not tried to take any medication for the pain. She has been doing ankle flexion and extension to stretch the foot.   Health Maintenance Colonoscopy: agreeable to GI referral  Covid Vaccine:states she has received her covid vaccines, immunization record updated  PERTINENT  PMH / PSH:  HTN GERD Ostearthritis of knee   OBJECTIVE:   BP (!) 174/90   Pulse 92   Wt 178 lb (80.7 kg)   SpO2 100%   BMI 33.63 kg/m    Foot: Inspection:  No obvious bony deformity.  No swelling, erythema, or bruising.  Normal arch Palpation:  tenderness to palpation on anterior portion of calcaneal region of foot  ROM: Full  ROM of the ankle. Normal midfoot flexibility Strength: 5/5 strength ankle in all planes Neurovascular: N/V intact distally in the lower extremity  ASSESSMENT/PLAN:   Healthcare maintenance Updated COVID vaccine records  GI referral for colonoscopy  Shingrix vaccine printed script    Plantar fasciitis Symptoms and PE most consistent with plantar fascitis. Patient given handout on stretches and exercises to do to help with pain.      Eulis Foster, MD [place]

## 2021-06-11 ENCOUNTER — Ambulatory Visit (INDEPENDENT_AMBULATORY_CARE_PROVIDER_SITE_OTHER): Payer: PRIVATE HEALTH INSURANCE | Admitting: Family Medicine

## 2021-06-11 ENCOUNTER — Other Ambulatory Visit: Payer: Self-pay

## 2021-06-11 ENCOUNTER — Encounter: Payer: Self-pay | Admitting: Family Medicine

## 2021-06-11 VITALS — BP 174/90 | HR 92 | Wt 178.0 lb

## 2021-06-11 DIAGNOSIS — Z0000 Encounter for general adult medical examination without abnormal findings: Secondary | ICD-10-CM

## 2021-06-11 DIAGNOSIS — M722 Plantar fascial fibromatosis: Secondary | ICD-10-CM | POA: Diagnosis not present

## 2021-06-11 DIAGNOSIS — Z1211 Encounter for screening for malignant neoplasm of colon: Secondary | ICD-10-CM | POA: Diagnosis not present

## 2021-06-11 MED ORDER — SHINGRIX 50 MCG/0.5ML IM SUSR: 0.5000 mL | Freq: Once | INTRAMUSCULAR | 0 refills | Status: AC

## 2021-06-11 NOTE — Patient Instructions (Signed)
Plantar Fasciitis  Plantar fasciitis is a painful foot condition that affects the heel. It occurs when the band of tissue that connects the toes to the heel bone (plantar fascia) becomes irritated. This can happen as the result of exercising too much or doing other repetitive activities (overuse injury). Plantar fasciitis can cause mild irritation to severe pain that makes it difficult to walk or move. The pain is usually worse in the morning after sleeping, or after sitting or lying down for a period of time. Pain may also beworse after long periods of walking or standing. What are the causes? This condition may be caused by: Standing for long periods of time. Wearing shoes that do not have good arch support. Doing activities that put stress on joints (high-impact activities). This includes ballet and exercise that makes your heart beat faster (aerobic exercise), such as running. Being overweight. An abnormal way of walking (gait). Tight muscles in the back of your lower leg (calf). High arches in your feet or flat feet. Starting a new athletic activity. What are the signs or symptoms? The main symptom of this condition is heel pain. Pain may get worse after the following: Taking the first steps after a time of rest, especially in the morning after awakening, or after you have been sitting or lying down for a while. Long periods of standing still. Pain may decrease after 30-45 minutes of activity, such as gentle walking. How is this diagnosed? This condition may be diagnosed based on your medical history, a physical exam, and your symptoms. Your health care provider will check for: A tender area on the bottom of your foot. A high arch in your foot or flat feet. Pain when you move your foot. Difficulty moving your foot. You may have imaging tests to confirm the diagnosis, such as: X-rays. Ultrasound. MRI. How is this treated? Treatment for plantar fasciitis depends on how severe your  condition is. Treatment may include: Rest, ice, pressure (compression), and raising (elevating) the affected foot. This is called RICE therapy. Your health care provider may recommend RICE therapy along with over-the-counter pain medicines to manage your pain. Exercises to stretch your calves and your plantar fascia. A splint that holds your foot in a stretched, upward position while you sleep (night splint). Physical therapy to relieve symptoms and prevent problems in the future. Injections of steroid medicine (cortisone) to relieve pain and inflammation. Stimulating your plantar fascia with electrical impulses (extracorporeal shock wave therapy). This is usually the last treatment option before surgery. Surgery, if other treatments have not worked after 12 months. Follow these instructions at home: Managing pain, stiffness, and swelling  If directed, put ice on the painful area. To do this: Put ice in a plastic bag, or use a frozen bottle of water. Place a towel between your skin and the bag or bottle. Roll the bottom of your foot over the bag or bottle. Do this for 20 minutes, 2-3 times a day. Wear athletic shoes that have air-sole or gel-sole cushions, or try soft shoe inserts that are designed for plantar fasciitis. Elevate your foot above the level of your heart while you are sitting or lying down.  Activity Avoid activities that cause pain. Ask your health care provider what activities are safe for you. Do physical therapy exercises and stretches as told by your health care provider. Try activities and forms of exercise that are easier on your joints (low impact). Examples include swimming, water aerobics, and biking. General instructions Take over-the-counter   and prescription medicines only as told by your health care provider. Wear a night splint while sleeping, if told by your health care provider. Loosen the splint if your toes tingle, become numb, or turn cold and blue. Maintain  a healthy weight, or work with your health care provider to lose weight as needed. Keep all follow-up visits. This is important. Contact a health care provider if you have: Symptoms that do not go away with home treatment. Pain that gets worse. Pain that affects your ability to move or do daily activities.   Plantar Fasciitis Rehab Ask your health care provider which exercises are safe for you. Do exercises exactly as told by your health care provider and adjust them as directed. It is normal to feel mild stretching, pulling, tightness, or discomfort as you do these exercises. Stop right away if you feel sudden pain or your pain gets worse. Do not begin these exercises until told by your health care provider. Stretching and range-of-motion exercises These exercises warm up your muscles and joints and improve the movement andflexibility of your foot. These exercises also help to relieve pain. Plantar fascia stretch  Sit with your left / right leg crossed over your opposite knee. Hold your heel with one hand with that thumb near your arch. With your other hand, hold your toes and gently pull them back toward the top of your foot. You should feel a stretch on the base (bottom) of your toes, or the bottom of your foot (plantar fascia), or both. Hold this stretch for______20____ seconds. Slowly release your toes and return to the starting position.  Gastrocnemius stretch, standing This exercise is also called a calf (gastroc) stretch. It stretches the muscles in the back of the upper calf. Stand with your hands against a wall. Extend your left / right leg behind you, and bend your front knee slightly. Keeping your heels on the floor, your toes facing forward, and your back knee straight, shift your weight toward the wall. Do not arch your back. You should feel a gentle stretch in your upper calf. Hold this position for _____15_____ seconds.  Soleus stretch, standing This exercise is also called  a calf (soleus) stretch. It stretches the muscles in the back of the lower calf. Stand with your hands against a wall. Extend your left / right leg behind you, and bend your front knee slightly. Keeping your heels on the floor and your toes facing forward, bend your back knee and shift your weight slightly over your back leg. You should feel a gentle stretch deep in your lower calf. Hold this position for __20________ seconds.  Gastroc and soleus stretch, standing step This exercise stretches the muscles in the back of the lower leg. These muscles are in the upper calf (gastrocnemius) and the lower calf (soleus). Stand with the ball of your left / right foot on the front of a step. The ball of your foot is on the walking surface, right under your toes. Keep your other foot firmly on the same step. Hold on to the wall or a railing for balance. Slowly lift your other foot, allowing your body weight to press your heel down over the edge of the front of the step. Keep knee straight and unbent. You should feel a stretch in your calf. Hold this position for ____15______ seconds. Return both feet to the step. Repeat this exercise with a slight bend in your left / right knee.  Balance exercise This exercise builds your balance  and strength control of your arch to helptake pressure off your plantar fascia. Single leg stand If this exercise is too easy, you can try it with your eyes closed or while standing on a pillow. Without shoes, stand near a railing or in a doorway. You may hold on to the railing or door frame as needed. Stand on your left / right foot. Keep your big toe down on the floor and lift the arch of your foot. You should feel a stretch across the bottom of your foot and your arch. Do not let your foot roll inward. Hold this position for ______15____ seconds. Repeat ____3______ times. Complete this exercise _____2-3_____ times a day. This information is not intended to replace advice  given to you by your health care provider. Make sure you discuss any questions you have with your healthcare provider. Document Revised: 08/02/2020 Document Reviewed: 08/02/2020 Elsevier Patient Education  [place].

## 2021-06-16 DIAGNOSIS — M722 Plantar fascial fibromatosis: Secondary | ICD-10-CM | POA: Insufficient documentation

## 2021-06-16 DIAGNOSIS — Z0000 Encounter for general adult medical examination without abnormal findings: Secondary | ICD-10-CM | POA: Insufficient documentation

## 2021-06-16 NOTE — Assessment & Plan Note (Signed)
Symptoms and PE most consistent with plantar fascitis. Patient given handout on stretches and exercises to do to help with pain.

## 2021-06-16 NOTE — Assessment & Plan Note (Signed)
Updated COVID vaccine records  GI referral for colonoscopy  Shingrix vaccine printed script

## 2021-06-26 ENCOUNTER — Telehealth: Payer: Self-pay | Admitting: Family Medicine

## 2021-06-26 ENCOUNTER — Encounter: Payer: Self-pay | Admitting: Family Medicine

## 2021-06-26 MED ORDER — FLUCONAZOLE 150 MG PO TABS: 150.0000 mg | ORAL_TABLET | Freq: Once | ORAL | 0 refills | Status: AC

## 2021-06-26 NOTE — Telephone Encounter (Signed)
Contacted patient in regards to elevated blood pressure from previous visit on 8/9. Patient was unable to schedule two week follow up at that time. Blood pressure was 0000000 systolic. She reports that she has not a chance to measure her blood pressure but states this has been a issue for her in the past so she has been following with cardiology but states her next appointment is not until Jan of 2023. Recommended that patient be evaluated in the next few weeks to which she was in agreement. Patient scheduled with Dr. Janus Molder for HTN follow up on 9/9 at 1:50PM.   Patient states she recently completed an abx course and has been having vaginal irritation and white discharge.  Prescribed diflucan.   Eulis Foster, MD [place], PGY-3 [phone]

## 2021-07-12 ENCOUNTER — Other Ambulatory Visit: Payer: Self-pay

## 2021-07-12 ENCOUNTER — Encounter: Payer: Self-pay | Admitting: Family Medicine

## 2021-07-12 ENCOUNTER — Ambulatory Visit (INDEPENDENT_AMBULATORY_CARE_PROVIDER_SITE_OTHER): Payer: PRIVATE HEALTH INSURANCE | Admitting: Family Medicine

## 2021-07-12 VITALS — BP 161/95 | HR 92 | Wt 176.4 lb

## 2021-07-12 DIAGNOSIS — I10 Essential (primary) hypertension: Secondary | ICD-10-CM

## 2021-07-12 NOTE — Progress Notes (Signed)
    SUBJECTIVE:   CHIEF COMPLAINT / HPI:   Ms. Minkler 50 yo F who presents for follow up for the following  Hypertension - Medications: Candesartan 32 mg nightly, hydralazine 25 mg twice daily, indapamide 2.5 mg daily, spironolactone 50 mg nightly - Compliance: Yes - Checking BP at home: No, does have cuff - Denies any SOB, CP, vision changes, LE edema, medication SEs, or symptoms of hypotension - Diet: Endorses she eats a lot of chips and her diet is high in salt - Exercise: She does not work out at all. Admits she needs to walk more.   Of note, she states Cardiologist is managing her medication and would not like to make any changes today without notifying them. Her last appointment was January and her next appointment is Feb 2023.   PERTINENT  PMH / PSH: Systolic murmur, LVH  OBJECTIVE:   BP (!) 161/95   Pulse 92   Wt 176 lb 6.4 oz (80 kg)   SpO2 100%   BMI 33.33 kg/m   Physical Exam Vitals reviewed.  Constitutional:      General: She is not in acute distress.    Appearance: She is not ill-appearing or diaphoretic.  Cardiovascular:     Rate and Rhythm: Normal rate and regular rhythm.     Comments: Systolic murmur appreciable at LSB Pulmonary:     Effort: Pulmonary effort is normal.     Breath sounds: Normal breath sounds.  Musculoskeletal:     Right lower leg: No edema.     Left lower leg: No edema.  Neurological:     Mental Status: She is alert.    ASSESSMENT/PLAN:   Resistant hypertension Elevated today. Asymptomatic. Endorsing compliance with medication but a high salt diet. Discussed possible medication changes. Patient would like to contact cardiology for an earlier appointment. In the meantime we will work on lifestyle modifications such as DASH diet and increased cardio activity such as walking more. Suggested taking BP daily or every other day, keeping a log, and following up in 2 weeks for a BP check. Will obtain BMP to check Cr.  - Basic Metabolic  Panel     Gerlene Fee, [place]

## 2021-07-12 NOTE — Patient Instructions (Addendum)
Today we discussed ways to lower blood pressure with lifestyle changes. Specifically for you it will be decreasing salt intake for now and considering adopting the DASH diet. We will follow up in 2 weeks for a BP recheck. This could be a nursing visit. In the meantime you can contact your cardiologist for an earlier appointment.   Dr. Janus Molder  DASH Eating Plan DASH stands for Dietary Approaches to Stop Hypertension. The DASH eating plan is a healthy eating plan that has been shown to: Reduce high blood pressure (hypertension). Reduce your risk for type 2 diabetes, heart disease, and stroke. Help with weight loss. What are tips for following this plan? Reading food labels Check food labels for the amount of salt (sodium) per serving. Choose foods with less than 5 percent of the Daily Value of sodium. Generally, foods with less than 300 milligrams (mg) of sodium per serving fit into this eating plan. To find whole grains, look for the word "whole" as the first word in the ingredient list. Shopping Buy products labeled as "low-sodium" or "no salt added." Buy fresh foods. Avoid canned foods and pre-made or frozen meals. Cooking Avoid adding salt when cooking. Use salt-free seasonings or herbs instead of table salt or sea salt. Check with your health care provider or pharmacist before using salt substitutes. Do not fry foods. Cook foods using healthy methods such as baking, boiling, grilling, roasting, and broiling instead. Cook with heart-healthy oils, such as olive, canola, avocado, soybean, or sunflower oil. Meal planning  Eat a balanced diet that includes: 4 or more servings of fruits and 4 or more servings of vegetables each day. Try to fill one-half of your plate with fruits and vegetables. 6-8 servings of whole grains each day. Less than 6 oz (170 g) of lean meat, poultry, or fish each day. A 3-oz (85-g) serving of meat is about the same size as a deck of cards. One egg equals 1 oz (28  g). 2-3 servings of low-fat dairy each day. One serving is 1 cup (237 mL). 1 serving of nuts, seeds, or beans 5 times each week. 2-3 servings of heart-healthy fats. Healthy fats called omega-3 fatty acids are found in foods such as walnuts, flaxseeds, fortified milks, and eggs. These fats are also found in cold-water fish, such as sardines, salmon, and mackerel. Limit how much you eat of: Canned or prepackaged foods. Food that is high in trans fat, such as some fried foods. Food that is high in saturated fat, such as fatty meat. Desserts and other sweets, sugary drinks, and other foods with added sugar. Full-fat dairy products. Do not salt foods before eating. Do not eat more than 4 egg yolks a week. Try to eat at least 2 vegetarian meals a week. Eat more home-cooked food and less restaurant, buffet, and fast food. Lifestyle When eating at a restaurant, ask that your food be prepared with less salt or no salt, if possible. If you drink alcohol: Limit how much you use to: 0-1 drink a day for women who are not pregnant. 0-2 drinks a day for men. Be aware of how much alcohol is in your drink. In the [place] one drink equals one 12 oz bottle of beer (355 mL), one 5 oz glass of wine (148 mL), or one 1 oz glass of hard liquor (44 mL). General information Avoid eating more than 2,300 mg of salt a day. If you have hypertension, you may need to reduce your sodium intake to 1,500 mg a  day. Work with your health care provider to maintain a healthy body weight or to lose weight. Ask what an ideal weight is for you. Get at least 30 minutes of exercise that causes your heart to beat faster (aerobic exercise) most days of the week. Activities may include walking, swimming, or biking. Work with your health care provider or dietitian to adjust your eating plan to your individual calorie needs. What foods should I eat? Fruits All fresh, dried, or frozen fruit. Canned fruit in natural juice (without added  sugar). Vegetables Fresh or frozen vegetables (raw, steamed, roasted, or grilled). Low-sodium or reduced-sodium tomato and vegetable juice. Low-sodium or reduced-sodium tomato sauce and tomato paste. Low-sodium or reduced-sodium canned vegetables. Grains Whole-grain or whole-wheat bread. Whole-grain or whole-wheat pasta. Brown rice. [place]. Bulgur. Whole-grain and low-sodium cereals. Pita bread. Low-fat, low-sodium crackers. Whole-wheat flour tortillas. Meats and other proteins Skinless chicken or [place]. Ground chicken or [place]. Pork with fat trimmed off. Fish and seafood. Egg whites. Dried beans, peas, or lentils. Unsalted nuts, nut butters, and seeds. Unsalted canned beans. Lean cuts of beef with fat trimmed off. Low-sodium, lean precooked or cured meat, such as sausages or meat loaves. Dairy Low-fat (1%) or fat-free (skim) milk. Reduced-fat, low-fat, or fat-free cheeses. Nonfat, low-sodium ricotta or cottage cheese. Low-fat or nonfat yogurt. Low-fat, low-sodium cheese. Fats and oils Soft margarine without trans fats. Vegetable oil. Reduced-fat, low-fat, or light mayonnaise and salad dressings (reduced-sodium). Canola, safflower, olive, avocado, soybean, and sunflower oils. Avocado. Seasonings and condiments Herbs. Spices. Seasoning mixes without salt. Other foods Unsalted popcorn and pretzels. Fat-free sweets. The items listed above may not be a complete list of foods and beverages you can eat. Contact a dietitian for more information. What foods should I avoid? Fruits Canned fruit in a light or heavy syrup. Fried fruit. Fruit in cream or butter sauce. Vegetables Creamed or fried vegetables. Vegetables in a cheese sauce. Regular canned vegetables (not low-sodium or reduced-sodium). Regular canned tomato sauce and paste (not low-sodium or reduced-sodium). Regular tomato and vegetable juice (not low-sodium or reduced-sodium). Angie Fava. Olives. Grains Baked goods made with fat, such  as croissants, muffins, or some breads. Dry pasta or rice meal packs. Meats and other proteins Fatty cuts of meat. Ribs. Fried meat. Berniece Salines. Bologna, salami, and other precooked or cured meats, such as sausages or meat loaves. Fat from the back of a pig (fatback). Bratwurst. Salted nuts and seeds. Canned beans with added salt. Canned or smoked fish. Whole eggs or egg yolks. Chicken or [place] with skin. Dairy Whole or 2% milk, cream, and half-and-half. Whole or full-fat cream cheese. Whole-fat or sweetened yogurt. Full-fat cheese. Nondairy creamers. Whipped toppings. Processed cheese and cheese spreads. Fats and oils Butter. Stick margarine. Lard. Shortening. Ghee. Bacon fat. Tropical oils, such as coconut, palm kernel, or palm oil. Seasonings and condiments Onion salt, garlic salt, seasoned salt, table salt, and sea salt. Worcestershire sauce. Tartar sauce. Barbecue sauce. Teriyaki sauce. Soy sauce, including reduced-sodium. Steak sauce. Canned and packaged gravies. Fish sauce. Oyster sauce. Cocktail sauce. Store-bought horseradish. Ketchup. Mustard. Meat flavorings and tenderizers. Bouillon cubes. Hot sauces. Pre-made or packaged marinades. Pre-made or packaged taco seasonings. Relishes. Regular salad dressings. Other foods Salted popcorn and pretzels. The items listed above may not be a complete list of foods and beverages you should avoid. Contact a dietitian for more information. Where to find more information National Heart, Lung, and Blood Institute: https://wilson-eaton.com/ American Heart Association: www.heart.org Academy of Nutrition and Dietetics: www.eatright.org National Kidney  Foundation: www.kidney.org Summary The DASH eating plan is a healthy eating plan that has been shown to reduce high blood pressure (hypertension). It may also reduce your risk for type 2 diabetes, heart disease, and stroke. When on the DASH eating plan, aim to eat more fresh fruits and vegetables, whole grains, lean  proteins, low-fat dairy, and heart-healthy fats. With the DASH eating plan, you should limit salt (sodium) intake to 2,300 mg a day. If you have hypertension, you may need to reduce your sodium intake to 1,500 mg a day. Work with your health care provider or dietitian to adjust your eating plan to your individual calorie needs. This information is not intended to replace advice given to you by your health care provider. Make sure you discuss any questions you have with your health care provider. Document Revised: 09/23/2019 Document Reviewed: 09/23/2019 Elsevier Patient Education  2022 [employer].

## 2021-07-13 LAB — BASIC METABOLIC PANEL
BUN/Creatinine Ratio: 18 (ref 9–23)
BUN: 21 mg/dL (ref 6–24)
CO2: 19 mmol/L — ABNORMAL LOW (ref 20–29)
Calcium: 10.2 mg/dL (ref 8.7–10.2)
Chloride: 98 mmol/L (ref 96–106)
Creatinine, Ser: 1.15 mg/dL — ABNORMAL HIGH (ref 0.57–1.00)
Glucose: 99 mg/dL (ref 65–99)
Potassium: 4.4 mmol/L (ref 3.5–5.2)
Sodium: 138 mmol/L (ref 134–144)
eGFR: 58 mL/min/{1.73_m2} — ABNORMAL LOW (ref >59)

## 2021-07-13 NOTE — Assessment & Plan Note (Addendum)
Elevated today. Asymptomatic. Endorsing compliance with medication but a high salt diet. Discussed possible medication changes. Patient would like to contact cardiology for an earlier appointment. In the meantime we will work on lifestyle modifications such as DASH diet and increased cardio activity such as walking more. Suggested taking BP daily or every other day, keeping a log, and following up in 2 weeks for a BP check. Will obtain BMP to check Cr.  - Basic Metabolic Panel

## 2021-10-18 ENCOUNTER — Other Ambulatory Visit: Payer: Self-pay | Admitting: Family Medicine

## 2021-10-18 DIAGNOSIS — I10 Essential (primary) hypertension: Secondary | ICD-10-CM

## 2021-11-27 ENCOUNTER — Other Ambulatory Visit: Payer: Self-pay

## 2021-11-27 ENCOUNTER — Ambulatory Visit (HOSPITAL_COMMUNITY): Payer: PRIVATE HEALTH INSURANCE | Attending: Cardiovascular Disease

## 2021-11-27 DIAGNOSIS — I517 Cardiomegaly: Secondary | ICD-10-CM

## 2021-11-27 LAB — ECHOCARDIOGRAM COMPLETE
Area-P 1/2: 3.33 cm2
S' Lateral: 2.3 cm

## 2021-12-09 NOTE — Progress Notes (Signed)
Cardiology Office Note   Date:  12/10/2021   ID:  Pamela Schneider, DOB 07-28-1971, MRN [id number]  PCP:  Eulis Foster, MD  Cardiologist:   Minus Breeding, MD Referring:  Eulis Foster, MD  Chief Complaint  Patient presents with   Hypertension      History of Present Illness: Pamela Schneider is a 51 y.o. female who is referred by Eulis Foster, MD for evaluation of difficult to control hypertension.  She has very strong family history of hypertension.  She has not always been particular compliant with medications until more recently.  She has had a work-up to include negative renal Dopplers. She had an echo in March and she had an EF of 55 - 60%.  She has moderate concentric hypertrophy.  There is been no clear secondary cause.    Since I last saw her she has felt okay.  The patient denies any new symptoms such as chest discomfort, neck or arm discomfort. There has been no new shortness of breath, PND or orthopnea. There have been no reported palpitations, presyncope or syncope.   Unfortunately her blood pressure today is not controlled.  We gave her a blood pressure cuff at the last appointment but she says its not working properly so she is not quite sure what it is at home.    Past Medical History:  Diagnosis Date   CHEST PAIN, ATYPICAL 08/08/2010   Qualifier: Diagnosis of  By: Loraine Maple MD, Jacquelyn     Essential hypertension 12/31/2006   Qualifier: Diagnosis of  By: Beryle Lathe     History of anemia 03/12/2018   Hypertension     Past Surgical History:  Procedure Laterality Date   TUBAL LIGATION       Current Outpatient Medications  Medication Sig Dispense Refill   candesartan (ATACAND) 32 MG tablet Take 1 tablet (32 mg total) by mouth at bedtime. 30 tablet 11   fluticasone (FLONASE) 50 MCG/ACT nasal spray Place 1 spray into both nostrils daily. 1 spray in each nostril every day (Patient taking differently: Place 1 spray  into both nostrils as needed. 1 spray in each nostril as needed.) 16 g 5   hydrALAZINE (APRESOLINE) 25 MG tablet Take 1 tablet (25 mg total) by mouth 2 (two) times daily. 180 tablet 3   indapamide (LOZOL) 2.5 MG tablet Take 2 tablets (5 mg total) by mouth daily. 180 tablet 2   Multiple Vitamin (MULTIVITAMIN WITH MINERALS) TABS tablet Take 1 tablet by mouth daily.     omeprazole (PRILOSEC) 20 MG capsule TAKE 1 CAPSULE BY MOUTH DAILY AS NEEDED 90 capsule 3   spironolactone (ALDACTONE) 50 MG tablet Take 1 tablet (50 mg total) by mouth at bedtime. 90 tablet 3   dimenhyDRINATE (DRAMAMINE) 50 MG tablet Take 1 tablet (50 mg total) by mouth every 8 (eight) hours as needed. (Patient not taking: Reported on 12/10/2021) 42 tablet 0   ferrous sulfate 324 (65 Fe) MG TBEC Take 1 tablet (325 mg total) by mouth daily. (Patient not taking: Reported on 12/10/2021) 30 tablet 5   No current facility-administered medications for this visit.    Allergies:   Patient has no known allergies.    ROS:  Please see the history of present illness.   Otherwise, review of systems are positive for none.   All other systems are reviewed and negative.    PHYSICAL EXAM: VS:  BP (!) 164/86    Pulse 81    Ht 5\' 1"  (1.549  m)    Wt 179 lb (81.2 kg)    LMP  (Within Months)    SpO2 99%    BMI 33.82 kg/m  , BMI Body mass index is 33.82 kg/m. GENERAL:  Well appearing NECK:  No jugular venous distention, waveform within normal limits, carotid upstroke brisk and symmetric, no bruits, no thyromegaly LUNGS:  Clear to auscultation bilaterally CHEST:  Unremarkable HEART:  PMI not displaced or sustained,S1 and S2 within normal limits, no S3, no S4, no clicks, no rubs, no murmurs ABD:  Flat, positive bowel sounds normal in frequency in pitch, no bruits, no rebound, no guarding, no midline pulsatile mass, no hepatomegaly, no splenomegaly EXT:  2 plus pulses throughout, no edema, no cyanosis no clubbing   EKG:  EKG is  ordered today. Sinus  rhythm, rate 81, intervals within normal limits, nonspecific ST-T wave changes.  Abnormal P wave axis and axis shift since previous EKG.  Limb lead reversal.  Recent Labs: 02/26/2021: Hemoglobin 11.2; Platelets 221; TSH 2.350 07/12/2021: BUN 21; Creatinine, Ser 1.15; Potassium 4.4; Sodium 138    Lipid Panel    Component Value Date/Time   CHOL 142 02/26/2021 1254   TRIG 65 02/26/2021 1254   HDL 78 02/26/2021 1254   CHOLHDL 1.8 02/26/2021 1254   CHOLHDL 2.2 10/09/2015 1418   VLDL 22 10/09/2015 1418   LDLCALC 51 02/26/2021 1254      Wt Readings from Last 3 Encounters:  12/10/21 179 lb (81.2 kg)  07/12/21 176 lb 6.4 oz (80 kg)  06/11/21 178 lb (80.7 kg)      Other studies Reviewed: Additional studies/ records that were reviewed today include: None. Review of the above records demonstrates:  NA   ASSESSMENT AND PLAN:  HTN:   The blood pressure is not yet controlled.  I am going to increase the Lozol to 5 mg daily.  She is going to try to get her blood pressure cuff working and she can try to get readings at work as well.   LVH:   Echo last month demonstrated mild septal hypertrophy.  I will repeat an echo in 1 year.   Current medicines are reviewed at length with the patient today.  The patient does not have concerns regarding medicines.  The following changes have been made:  None  Labs/ tests ordered today include: None  Orders Placed This Encounter  Procedures   EKG 12-Lead     Disposition:   FU with me in 2 months.    Signed, Minus Breeding, MD  12/10/2021 5:39 PM     [facility]

## 2021-12-10 ENCOUNTER — Encounter: Payer: Self-pay | Admitting: Cardiology

## 2021-12-10 ENCOUNTER — Other Ambulatory Visit: Payer: Self-pay

## 2021-12-10 ENCOUNTER — Ambulatory Visit (INDEPENDENT_AMBULATORY_CARE_PROVIDER_SITE_OTHER): Payer: PRIVATE HEALTH INSURANCE | Admitting: Cardiology

## 2021-12-10 VITALS — BP 164/86 | HR 81 | Ht 61.0 in | Wt 179.0 lb

## 2021-12-10 DIAGNOSIS — I517 Cardiomegaly: Secondary | ICD-10-CM | POA: Diagnosis not present

## 2021-12-10 DIAGNOSIS — I10 Essential (primary) hypertension: Secondary | ICD-10-CM

## 2021-12-10 MED ORDER — INDAPAMIDE 2.5 MG PO TABS: 5.0000 mg | ORAL_TABLET | Freq: Every day | ORAL | 2 refills | Status: DC

## 2021-12-10 NOTE — Patient Instructions (Addendum)
Medication Instructions:  Lozol increase to 5 mg daily ( 2 tablets 2.5 mg)   *If you need a refill on your cardiac medications before your next appointment, please call your pharmacy*   Lab Work: Not needed    Testing/Procedures: Not needed   Follow-Up: At [facility], you and your health needs are our priority.  As part of our continuing mission to provide you with exceptional heart care, we have created designated Provider Care Teams.  These Care Teams include your primary Cardiologist (physician) and Advanced Practice Providers (APPs -  Physician Assistants and Nurse Practitioners) who all work together to provide you with the care you need, when you need it.     Your next appointment:   2 week(s)  The format for your next appointment:   In Person  Provider:   Coletta Memos, FNP, Fabian Sharp, PA-C, Sande Rives, PA-C, Caron Presume, PA-C, Jory Sims, DNP, ANP, or Almyra Deforest, PA-C     .

## 2021-12-12 ENCOUNTER — Other Ambulatory Visit: Payer: Self-pay | Admitting: Cardiology

## 2021-12-12 DIAGNOSIS — I10 Essential (primary) hypertension: Secondary | ICD-10-CM

## 2021-12-16 ENCOUNTER — Telehealth: Payer: Self-pay

## 2021-12-16 NOTE — Telephone Encounter (Signed)
Patient calls nurse line in regards to vaginal discharge and odor x 1 week. Patient is requesting medication to treat BV. Advised patient that she would likely need an appointment for treatment, however, patient declines and would like to wait for advisement from provider.   Please advise.   Talbot Grumbling, RN

## 2021-12-17 NOTE — Telephone Encounter (Signed)
LVM for patient to call and schedule an appointment.  Pamela Schneider, [place]

## 2021-12-24 ENCOUNTER — Encounter: Payer: Self-pay | Admitting: Physician Assistant

## 2021-12-24 ENCOUNTER — Ambulatory Visit (INDEPENDENT_AMBULATORY_CARE_PROVIDER_SITE_OTHER): Payer: PRIVATE HEALTH INSURANCE | Admitting: Physician Assistant

## 2021-12-24 ENCOUNTER — Other Ambulatory Visit: Payer: Self-pay

## 2021-12-24 VITALS — BP 144/78 | HR 79 | Ht 61.0 in | Wt 179.8 lb

## 2021-12-24 DIAGNOSIS — I10 Essential (primary) hypertension: Secondary | ICD-10-CM

## 2021-12-24 DIAGNOSIS — Z79899 Other long term (current) drug therapy: Secondary | ICD-10-CM

## 2021-12-24 LAB — BASIC METABOLIC PANEL
BUN/Creatinine Ratio: 24 — ABNORMAL HIGH (ref 9–23)
BUN: 31 mg/dL — ABNORMAL HIGH (ref 6–24)
CO2: 25 mmol/L (ref 20–29)
Calcium: 10 mg/dL (ref 8.7–10.2)
Chloride: 98 mmol/L (ref 96–106)
Creatinine, Ser: 1.28 mg/dL — ABNORMAL HIGH (ref 0.57–1.00)
Glucose: 95 mg/dL (ref 70–99)
Potassium: 4 mmol/L (ref 3.5–5.2)
Sodium: 138 mmol/L (ref 134–144)
eGFR: 51 mL/min/{1.73_m2} — ABNORMAL LOW (ref >59)

## 2021-12-24 MED ORDER — HYDRALAZINE HCL 50 MG PO TABS: ORAL_TABLET | ORAL | 1 refills | Status: DC

## 2021-12-24 NOTE — Progress Notes (Signed)
Cardiology Office Note:    Date:  12/25/2021   ID:  IVAN MASKELL, DOB February 27, 1971, MRN [id number]  PCP:  Eulis Foster, MD   [facility] Providers Cardiologist:  Minus Breeding, MD     Referring MD: Donnal Moat*   Chief Complaint  Patient presents with   Follow-up    Seen for Dr. Percival Spanish    History of Present Illness:    LAQUETTA RACEY is a 51 y.o. female with a hx of difficult to control hypertension.  She has a very strong family history of hypertension as well.  She has not been compliant with her medication until more recently.  Work-up included negative renal Doppler.  Echocardiogram in March 2021 demonstrated EF 55 to 60%, moderate LVH.  Repeat echocardiogram obtained in January 2023 demonstrated EF 55 to 60%, mild asymmetric LVH of basal septal segment, trivial MR.  She was recently seen by Dr. Percival Spanish on 12/10/2021 at which time her blood pressure remain very elevated.  Her Lozol was increased to 5 mg daily.  Patient presents today for follow-up.  She denies any recent chest discomfort or worsening dyspnea.  Unfortunately the blood pressure cuff we gave her last time was faulty, I will check with our charge nurse to see if we have another one.  During the meantime, we did give her a prescription for a new blood pressure cuff.  Her blood pressure on manual check by myself was 138/80.  I recommend to increase her to hydralazine to 50 mg twice daily for better blood pressure control.  If her systolic blood pressure still greater than 130s after 2 weeks, I would recommend she increase that hydralazine further to 50 mg 3 times a day.  Her systolic blood pressure goal should be 110s to 120s range.  Otherwise, she can follow-up with Dr. Percival Spanish in 3 months.    Past Medical History:  Diagnosis Date   CHEST PAIN, ATYPICAL 08/08/2010   Qualifier: Diagnosis of  By: Loraine Maple MD, [place]     Essential hypertension 12/31/2006   Qualifier: Diagnosis of   By: Beryle Lathe     History of anemia 03/12/2018   Hypertension     Past Surgical History:  Procedure Laterality Date   TUBAL LIGATION      Current Medications: Current Meds  Medication Sig   candesartan (ATACAND) 32 MG tablet TAKE 1 TABLET BY MOUTH NIGHTLY AT BEDTIME   fluticasone (FLONASE) 50 MCG/ACT nasal spray Place 1 spray into both nostrils daily. 1 spray in each nostril every day (Patient taking differently: Place 1 spray into both nostrils as needed. 1 spray in each nostril as needed.)   hydrALAZINE (APRESOLINE) 25 MG tablet TAKE 1 TABLET BY MOUTH 2 TIMES DAILY   hydrALAZINE (APRESOLINE) 50 MG tablet Take 1 table twice daily   indapamide (LOZOL) 2.5 MG tablet Take 2 tablets (5 mg total) by mouth daily.   Multiple Vitamin (MULTIVITAMIN WITH MINERALS) TABS tablet Take 1 tablet by mouth daily.   omeprazole (PRILOSEC) 20 MG capsule TAKE 1 CAPSULE BY MOUTH DAILY AS NEEDED   spironolactone (ALDACTONE) 50 MG tablet TAKE 1 TABLET BY MOUTH NIGHTLY AT BEDTIME     Allergies:   Patient has no known allergies.   Social History   Socioeconomic History   Marital status: Single    Spouse name: Not on file   Number of children: Not on file   Years of education: Not on file   Highest education level: Not on file  Occupational  History   Not on file  Tobacco Use   Smoking status: Never   Smokeless tobacco: Never  Vaping Use   Vaping Use: Never used  Substance and Sexual Activity   Alcohol use: Yes    Alcohol/week: 7.0 standard drinks    Types: 7 Glasses of wine per week   Drug use: Not on file   Sexual activity: Not on file  Other Topics Concern   Not on file  Social History Narrative   Not on file   Social Determinants of Health   Financial Resource Strain: Not on file  Food Insecurity: Not on file  Transportation Needs: Not on file  Physical Activity: Not on file  Stress: Not on file  Social Connections: Not on file     Family History: The patient's family  history includes Breast cancer in her mother; Heart disease in her father; Hypertension in her father and mother.  ROS:   Please see the history of present illness.     All other systems reviewed and are negative.  EKGs/Labs/Other Studies Reviewed:    The following studies were reviewed today:  Echo 11/27/2021 1. Left ventricular ejection fraction, by estimation, is 55 to 60%. Left  ventricular ejection fraction by 3D volume is 58 %. The left ventricle has  normal function. The left ventricle has no regional wall motion  abnormalities. There is mild asymmetric  left ventricular hypertrophy of the basal-septal segment. Left ventricular  diastolic parameters were normal. The average left ventricular global  longitudinal strain is -22.2 %. The global longitudinal strain is normal.   2. Right ventricular systolic function is normal. The right ventricular  size is normal. Tricuspid regurgitation signal is inadequate for assessing  PA pressure.   3. The mitral valve is grossly normal. Trivial mitral valve  regurgitation. No evidence of mitral stenosis.   4. The aortic valve is tricuspid. Aortic valve regurgitation is not  visualized. No aortic stenosis is present.   5. The inferior vena cava is normal in size with greater than 50%  respiratory variability, suggesting right atrial pressure of 3 mmHg.   Conclusion(s)/Recommendation(s): Normal biventricular function without  evidence of hemodynamically significant valvular heart disease.   EKG:  EKG is not ordered today.    Recent Labs: 02/26/2021: Hemoglobin 11.2; Platelets 221; TSH 2.350 12/24/2021: BUN 31; Creatinine, Ser 1.28; Potassium 4.0; Sodium 138  Recent Lipid Panel    Component Value Date/Time   CHOL 142 02/26/2021 1254   TRIG 65 02/26/2021 1254   HDL 78 02/26/2021 1254   CHOLHDL 1.8 02/26/2021 1254   CHOLHDL 2.2 10/09/2015 1418   VLDL 22 10/09/2015 1418   LDLCALC 51 02/26/2021 1254     Risk Assessment/Calculations:            Physical Exam:    VS:  BP (!) 144/78 (BP Location: Left Arm, Patient Position: Sitting, Cuff Size: Large)    Pulse 79    Ht 5\' 1"  (1.549 m)    Wt 179 lb 12.8 oz (81.6 kg)    SpO2 99%    BMI 33.97 kg/m     Wt Readings from Last 3 Encounters:  12/24/21 179 lb 12.8 oz (81.6 kg)  12/10/21 179 lb (81.2 kg)  07/12/21 176 lb 6.4 oz (80 kg)     GEN: Well nourished, well developed in no acute distress HEENT: Normal NECK: No JVD; No carotid bruits LYMPHATICS: No lymphadenopathy CARDIAC: RRR, no murmurs, rubs, gallops RESPIRATORY:  Clear to auscultation without rales, wheezing  or rhonchi  ABDOMEN: Soft, non-tender, non-distended MUSCULOSKELETAL:  No edema; No deformity  SKIN: Warm and dry NEUROLOGIC:  Alert and oriented x 3 PSYCHIATRIC:  Normal affect   ASSESSMENT:    1. Primary hypertension   2. Medication management    PLAN:    In order of problems listed above:  Hypertension: Blood pressure remains elevated today.  Continue candesartan, indapamide, spironolactone, increase hydralazine to 50 mg twice a day.  If systolic blood pressure remain greater than 130s mmHg after 2 weeks, she has been instructed to increase hydralazine further to 50 mg 3 times a day.  Medication management: Obtain basic metabolic panel           Medication Adjustments/Labs and Tests Ordered: Current medicines are reviewed at length with the patient today.  Concerns regarding medicines are outlined above.  Orders Placed This Encounter  Procedures   For home use only DME Other see comment   Basic metabolic panel   Meds ordered this encounter  Medications   hydrALAZINE (APRESOLINE) 50 MG tablet    Sig: Take 1 table twice daily    Dispense:  180 tablet    Refill:  1    Patient Instructions  Medication Instructions:  Increase Hydralazine 50 mg twice daily. In 2 weeks if blood pressure(top number) is greater than 130, increase Hydralazine 50 mg three times daily.   *If you need a  refill on your cardiac medications before your next appointment, please call your pharmacy*   Lab Work:Your physician recommends that you complete lab work today BMET  If you have labs (blood work) drawn today and your tests are completely normal, you will receive your results only by: [place] (if you have MyChart) OR A paper copy in the mail If you have any lab test that is abnormal or we need to change your treatment, we will call you to review the results.   Testing/Procedures: NONE ordered at this time of appointment    Follow-Up: At [facility], you and your health needs are our priority.  As part of our continuing mission to provide you with exceptional heart care, we have created designated Provider Care Teams.  These Care Teams include your primary Cardiologist (physician) and Advanced Practice Providers (APPs -  Physician Assistants and Nurse Practitioners) who all work together to provide you with the care you need, when you need it.  We recommend signing up for the patient portal called "MyChart".  Sign up information is provided on this After Visit Summary.  MyChart is used to connect with patients for Virtual Visits (Telemedicine).  Patients are able to view lab/test results, encounter notes, upcoming appointments, etc.  Non-urgent messages can be sent to your provider as well.   To learn more about what you can do with MyChart, go to [email or web address].    Your next appointment:   3 month(s)  The format for your next appointment:   In Person  Provider:   Minus Breeding, MD     Other Instructions      Signed, Almyra Deforest, [place]  12/25/2021 9:56 PM    [place]

## 2021-12-24 NOTE — Patient Instructions (Addendum)
Medication Instructions:  Increase Hydralazine 50 mg twice daily. In 2 weeks if blood pressure(top number) is greater than 130, increase Hydralazine 50 mg three times daily.   *If you need a refill on your cardiac medications before your next appointment, please call your pharmacy*   Lab Work:Your physician recommends that you complete lab work today BMET  If you have labs (blood work) drawn today and your tests are completely normal, you will receive your results only by: [place] (if you have MyChart) OR A paper copy in the mail If you have any lab test that is abnormal or we need to change your treatment, we will call you to review the results.   Testing/Procedures: NONE ordered at this time of appointment    Follow-Up: At [facility], you and your health needs are our priority.  As part of our continuing mission to provide you with exceptional heart care, we have created designated Provider Care Teams.  These Care Teams include your primary Cardiologist (physician) and Advanced Practice Providers (APPs -  Physician Assistants and Nurse Practitioners) who all work together to provide you with the care you need, when you need it.  We recommend signing up for the patient portal called "MyChart".  Sign up information is provided on this After Visit Summary.  MyChart is used to connect with patients for Virtual Visits (Telemedicine).  Patients are able to view lab/test results, encounter notes, upcoming appointments, etc.  Non-urgent messages can be sent to your provider as well.   To learn more about what you can do with MyChart, go to [email or web address].    Your next appointment:   3 month(s)  The format for your next appointment:   In Person  Provider:   Minus Breeding, MD     Other Instructions

## 2021-12-25 ENCOUNTER — Encounter: Payer: Self-pay | Admitting: Physician Assistant

## 2022-01-01 ENCOUNTER — Telehealth: Payer: Self-pay | Admitting: Cardiology

## 2022-01-01 NOTE — Telephone Encounter (Signed)
-  Pt state she was instructed to contact our office closer to the beginning of the month for blood pressure machine. ?-Nurse advise currently no machine available ?-pt state she would just take BP prescription to pharmacy to see if insurance will cover.  ?

## 2022-01-01 NOTE — Telephone Encounter (Signed)
Pt is wanting a bp monitor and would like to know if there are any available.. please advise  ?

## 2022-03-09 NOTE — Progress Notes (Signed)
?  ?Cardiology Office Note ? ? ?Date:  03/11/2022  ? ?ID:  Pamela Schneider, DOB 04-Mar-1971, MRN [id number] ? ?PCP:  Eulis Foster, MD  ?Cardiologist:   Minus Breeding, MD ?Referring:  Eulis Foster, MD ? ?Chief Complaint  ?Patient presents with  ? Hypertension  ? ? ?  ?History of Present Illness: ?Pamela Schneider is a 51 y.o. female who is referred by Eulis Foster, MD for evaluation of difficult to control hypertension.  She has very strong family history of hypertension.  She has not always been particular compliant with medications until more recently.  She has had a work-up to include negative renal Dopplers. She had an echo in March and she had an EF of 55 - 60%.  She has moderate concentric hypertrophy.  There is been no clear secondary cause.   ? ?Since I last saw her she has done well.  Her blood pressures have been 120s over 60s at home.  The patient denies any new symptoms such as chest discomfort, neck or arm discomfort. There has been no new shortness of breath, PND or orthopnea. There have been no reported palpitations, presyncope or syncope.  She is up on her feet all day long at work.  She has had a little swelling that she has noticed and thinks she retains a lot of fluid. ? ?Past Medical History:  ?Diagnosis Date  ? CHEST PAIN, ATYPICAL 08/08/2010  ? Qualifier: Diagnosis of  By: Loraine Maple MD, [place]    ? Essential hypertension 12/31/2006  ? Qualifier: Diagnosis of  By: Beryle Lathe    ? History of anemia 03/12/2018  ? Hypertension   ? ? ?Past Surgical History:  ?Procedure Laterality Date  ? TUBAL LIGATION    ? ? ? ?Current Outpatient Medications  ?Medication Sig Dispense Refill  ? candesartan (ATACAND) 32 MG tablet TAKE 1 TABLET BY MOUTH NIGHTLY AT BEDTIME 30 tablet 11  ? fluticasone (FLONASE) 50 MCG/ACT nasal spray Place 1 spray into both nostrils daily. 1 spray in each nostril every day (Patient taking differently: Place 1 spray into both nostrils as  needed. 1 spray in each nostril as needed.) 16 g 5  ? furosemide (LASIX) 20 MG tablet Take 1 tablet (20 mg total) by mouth daily as needed for fluid or edema (as needed for swelling). 20 tablet 3  ? hydrALAZINE (APRESOLINE) 50 MG tablet Take 1 table twice daily 180 tablet 1  ? indapamide (LOZOL) 2.5 MG tablet Take 2 tablets (5 mg total) by mouth daily. 180 tablet 2  ? Multiple Vitamin (MULTIVITAMIN WITH MINERALS) TABS tablet Take 1 tablet by mouth daily.    ? omeprazole (PRILOSEC) 20 MG capsule TAKE 1 CAPSULE BY MOUTH DAILY AS NEEDED 90 capsule 3  ? spironolactone (ALDACTONE) 50 MG tablet TAKE 1 TABLET BY MOUTH NIGHTLY AT BEDTIME 90 tablet 11  ? ?No current facility-administered medications for this visit.  ? ? ?Allergies:   Patient has no known allergies.  ? ? ?ROS:  Please see the history of present illness.   Otherwise, review of systems are positive for none.   All other systems are reviewed and negative.  ? ? ?PHYSICAL EXAM: ?VS:  BP 140/82   Pulse 90   Ht '5\' 1"'$  (1.549 m)   Wt 181 lb 9.6 oz (82.4 kg)   SpO2 98%   BMI 34.31 kg/m?  , BMI Body mass index is 34.31 kg/m?. ?GENERAL:  Well appearing ?NECK:  No jugular venous distention, waveform within normal limits,  carotid upstroke brisk and symmetric, no bruits, no thyromegaly ?LUNGS:  Clear to auscultation bilaterally ?CHEST:  Unremarkable ?HEART:  PMI not displaced or sustained,S1 and S2 within normal limits, no S3, no S4, no clicks, no rubs, no murmurs ?ABD:  Flat, positive bowel sounds normal in frequency in pitch, no bruits, no rebound, no guarding, no midline pulsatile mass, no hepatomegaly, no splenomegaly ?EXT:  2 plus pulses throughout, no edema, no cyanosis no clubbing ? ? ?EKG:  EKG is not ordered today. ? ?Recent Labs: ?12/24/2021: BUN 31; Creatinine, Ser 1.28; Potassium 4.0; Sodium 138  ? ? ?Lipid Panel ?   ?Component Value Date/Time  ? CHOL 142 02/26/2021 1254  ? TRIG 65 02/26/2021 1254  ? HDL 78 02/26/2021 1254  ? CHOLHDL 1.8 02/26/2021 1254  ?  CHOLHDL 2.2 10/09/2015 1418  ? VLDL 22 10/09/2015 1418  ? [place] 51 02/26/2021 1254  ? ?  ? ?Wt Readings from Last 3 Encounters:  ?03/11/22 181 lb 9.6 oz (82.4 kg)  ?12/24/21 179 lb 12.8 oz (81.6 kg)  ?12/10/21 179 lb (81.2 kg)  ?  ? ? ?Other studies Reviewed: ?Additional studies/ records that were reviewed today include: None. ?Review of the above records demonstrates:  NA ? ? ?ASSESSMENT AND PLAN: ? ?HTN:   Her blood pressure is well controlled on the meds as listed.  No change in therapy. ? ?LVH:     There is mild septal hypertrophy on echo in January of this year.  I will follow this up probably with an echo after I see her next year. ? ?EDEMA: She reports some occasional edema probably related to the Lozol.  We talked about salt and she seems to be pretty good with this.  I will give her Lasix 20 mg as needed to take rarely if she is having to use this frequently she will let me know. ? ? ?Current medicines are reviewed at length with the patient today.  The patient does not have concerns regarding medicines. ? ?The following changes have been made:  None ? ?Labs/ tests ordered today include:   None   ? ?No orders of the defined types were placed in this encounter. ? ? ? ?Disposition:   FU with me in 12 months.  ? ? ?Signed, ?Minus Breeding, MD  ?03/11/2022 5:29 PM    ?[place] ? ?

## 2022-03-11 ENCOUNTER — Encounter: Payer: Self-pay | Admitting: Cardiology

## 2022-03-11 ENCOUNTER — Ambulatory Visit (INDEPENDENT_AMBULATORY_CARE_PROVIDER_SITE_OTHER): Payer: PRIVATE HEALTH INSURANCE | Admitting: Cardiology

## 2022-03-11 VITALS — BP 140/82 | HR 90 | Ht 61.0 in | Wt 181.6 lb

## 2022-03-11 DIAGNOSIS — I517 Cardiomegaly: Secondary | ICD-10-CM

## 2022-03-11 DIAGNOSIS — I10 Essential (primary) hypertension: Secondary | ICD-10-CM

## 2022-03-11 MED ORDER — FUROSEMIDE 20 MG PO TABS: 20.0000 mg | ORAL_TABLET | Freq: Every day | ORAL | 3 refills | Status: DC | PRN

## 2022-03-11 NOTE — Patient Instructions (Signed)
Medication Instructions:  ?START: LASIX (FUROSEMIDE) '20mg'$  ONCE DAILY AS NEEDED  ?*If you need a refill on your cardiac medications before your next appointment, please call your pharmacy* ? ?Lab Work: ?None Ordered At This Time.  ?If you have labs (blood work) drawn today and your tests are completely normal, you will receive your results only by: ?MyChart Message (if you have MyChart) OR ?A paper copy in the mail ?If you have any lab test that is abnormal or we need to change your treatment, we will call you to review the results. ? ?Testing/Procedures: ?None Ordered At This Time.  ? ?Follow-Up: ?At [facility], you and your health needs are our priority.  As part of our continuing mission to provide you with exceptional heart care, we have created designated Provider Care Teams.  These Care Teams include your primary Cardiologist (physician) and Advanced Practice Providers (APPs -  Physician Assistants and Nurse Practitioners) who all work together to provide you with the care you need, when you need it. ? ?Your next appointment:   ?1 year(s) ? ?The format for your next appointment:   ?In Person ? ?Provider:   ?Minus Breeding, MD   ? ? ? ?  ?

## 2022-03-18 ENCOUNTER — Other Ambulatory Visit: Payer: Self-pay

## 2022-03-18 DIAGNOSIS — K2100 Gastro-esophageal reflux disease with esophagitis, without bleeding: Secondary | ICD-10-CM

## 2022-03-19 MED ORDER — OMEPRAZOLE 20 MG PO CPDR: 20.0000 mg | DELAYED_RELEASE_CAPSULE | Freq: Every day | ORAL | 1 refills | Status: DC | PRN

## 2022-04-08 ENCOUNTER — Encounter: Payer: Self-pay | Admitting: *Deleted

## 2022-04-29 ENCOUNTER — Other Ambulatory Visit: Payer: Self-pay | Admitting: Family Medicine

## 2022-04-29 ENCOUNTER — Ambulatory Visit
Admission: RE | Admit: 2022-04-29 | Discharge: 2022-04-29 | Disposition: A | Discharge status: Home / Self Care | Payer: PRIVATE HEALTH INSURANCE | Source: Ambulatory Visit | Attending: Family Medicine | Admitting: Family Medicine

## 2022-04-29 DIAGNOSIS — Z139 Encounter for screening, unspecified: Secondary | ICD-10-CM

## 2022-06-06 ENCOUNTER — Other Ambulatory Visit: Payer: Self-pay | Admitting: Cardiology

## 2022-06-10 ENCOUNTER — Ambulatory Visit: Payer: PRIVATE HEALTH INSURANCE | Admitting: Nurse Practitioner

## 2022-06-10 DIAGNOSIS — T148XXA Other injury of unspecified body region, initial encounter: Secondary | ICD-10-CM

## 2022-06-10 NOTE — Progress Notes (Signed)
  Acute Office Visit  Subjective:     Patient ID: Pamela Schneider, female    DOB: [birth year], 51 y.o.   MRN: [id number]  Chief Complaint  Patient presents with   Abrasion    HPI Patient is in today for c/o of a skin abrasion on left sided of her back. Noticed this for the first time this morning. Reports some burning to site. Endorses using a little bit of neosporin. Denies injury, itching, or trauma to site. Denies fevers or chills.   Review of Systems  Skin:  Negative for itching and rash.        Objective:    There were no vitals taken for this visit.   Physical Exam Constitutional:      General: She is not in acute distress. Skin:    Findings: Erythema present. No abscess, rash or wound.          Comments: Small abrasion noted to area in which bra straps is present. Some erythema is present. No drainage noted.   Neurological:     General: No focal deficit present.     Mental Status: She is alert and oriented to person, place, and time.     No results found for any visits on 06/10/22.      Assessment & Plan:   Problem List Items Addressed This Visit   None Visit Diagnoses     Abrasion of skin    -  Primary  Recommended use of neosporin and aquaphor to site. Avoid placing bra strap to area as this may have cause abrasion.  Recommended keeping area clean and dry. Follow-up if symptoms worsen or fail to improve.      No orders of the defined types were placed in this encounter.   Lurena Joiner, NP

## 2022-06-17 ENCOUNTER — Encounter: Payer: Self-pay | Admitting: Nurse Practitioner

## 2022-06-17 ENCOUNTER — Ambulatory Visit: Payer: PRIVATE HEALTH INSURANCE | Admitting: Nurse Practitioner

## 2022-06-17 VITALS — BP 122/84 | HR 87

## 2022-06-17 DIAGNOSIS — Z789 Other specified health status: Secondary | ICD-10-CM

## 2022-06-17 NOTE — Progress Notes (Signed)
Office Visit   Subjective:  Patient ID: Pamela Schneider, female    DOB: 1971-09-15  Age: 51 y.o. MRN: [id number]  CC: Wellness Exam   HPI Pamela Schneider presents for wellness exam visit for insurance benefit.  Patient has a PCP: Dr. Ronnald Ramp PMH significant for: HTN Last labs per PCP were completed: Feb 2023  Health Maintenance:  Colonoscopy: Due for this. Mammogram: June 2023 PAP: April 2022    Smoker: never  Immunizations:  Shingrix-  Due for this.  COVID- x 4. Gets yearly flu.   Lifestyle: Diet- tries to incorporate more fruits. Admits to snacking.  Exercise- no. Pretty active at work.      Past Medical History:  Diagnosis Date   CHEST PAIN, ATYPICAL 08/08/2010   Qualifier: Diagnosis of  By: Loraine Maple MD, [place]     Essential hypertension 12/31/2006   Qualifier: Diagnosis of  By: Beryle Lathe     History of anemia 03/12/2018   Hypertension     Past Surgical History:  Procedure Laterality Date   TUBAL LIGATION      Outpatient Medications Prior to Visit  Medication Sig Dispense Refill   candesartan (ATACAND) 32 MG tablet TAKE 1 TABLET BY MOUTH NIGHTLY AT BEDTIME 30 tablet 11   furosemide (LASIX) 20 MG tablet TAKE 1 TABLET BY MOUTH DAILY AS NEEDED FOR fluid OR EDEMA 90 tablet 3   hydrALAZINE (APRESOLINE) 50 MG tablet Take 1 table twice daily 180 tablet 1   indapamide (LOZOL) 2.5 MG tablet Take 2 tablets (5 mg total) by mouth daily. 180 tablet 2   Multiple Vitamin (MULTIVITAMIN WITH MINERALS) TABS tablet Take 1 tablet by mouth daily.     omeprazole (PRILOSEC) 20 MG capsule Take 1 capsule (20 mg total) by mouth daily as needed. 90 capsule 1   spironolactone (ALDACTONE) 50 MG tablet TAKE 1 TABLET BY MOUTH NIGHTLY AT BEDTIME 90 tablet 11   fluticasone (FLONASE) 50 MCG/ACT nasal spray Place 1 spray into both nostrils daily. 1 spray in each nostril every day (Patient taking differently: Place 1 spray into both nostrils as needed. 1 spray in each nostril as  needed.) 16 g 5   No facility-administered medications prior to visit.    ROS Review of Systems  Respiratory:  Negative for shortness of breath.   Cardiovascular:  Negative for chest pain and leg swelling.  Skin:        Still slight itching from abrasion site.   Neurological:  Negative for headaches.    Objective:  BP 122/84   Pulse 87   SpO2 99%   Physical Exam Constitutional:      General: She is not in acute distress. HENT:     Head: Normocephalic.  Cardiovascular:     Rate and Rhythm: Normal rate and regular rhythm.     Heart sounds: Normal heart sounds.  Pulmonary:     Effort: Pulmonary effort is normal.     Breath sounds: Normal breath sounds.  Abdominal:     General: Bowel sounds are normal.     Palpations: Abdomen is soft.  Musculoskeletal:        General: Normal range of motion.  Skin:    General: Skin is warm and dry.     Comments: Area of previous skin abrasion on left upper back is now dry. No signs of infection.   Neurological:     General: No focal deficit present.     Mental Status: She is alert and oriented to person, place,  and time.  Psychiatric:        Mood and Affect: Mood normal.        Behavior: Behavior normal.      Assessment & Plan:    Pamela Schneider was seen today for wellness exam.  Diagnoses and all orders for this visit:  Participant in health and wellness plan Adult wellness physical was conducted today. Importance of diet and exercise were discussed in detail.  We reviewed immunizations and gave recommendations regarding current immunization needed for age. Patient plans to let me know once she is ready for Shingrix so this can be provided in wellness clinic. Preventative health exams needed: Colonoscopy. Patient plans to discuss this with her PCP at her next physical.    Patient was advised yearly wellness exam Recommended aquaphor or cerve for dry skin area where previous abrasion was present.    No orders of the defined types  were placed in this encounter.   No orders of the defined types were placed in this encounter.   Follow-up: as needed.

## 2022-06-28 NOTE — Progress Notes (Signed)
    SUBJECTIVE:   Chief compliant/HPI: annual examination  Pamela Schneider is a 51 y.o. who presents today for an annual exam.   May come back back for STD testing at a later visit as she is just coming off her period and does not want to do swabs today. No new partner but would like to be tested to be safe.   Stopped having periods in Dec 2022 then period occurred mid July, and then again end of July, then Last week. Periods were consistent with previous periods, not abnormally heavy or painful.     OBJECTIVE:   Vitals:   07/01/22 0826 07/01/22 0854  BP: (!) 145/78 135/78  Pulse: 83   SpO2: 100%      ASSESSMENT/PLAN:    Annual Examination  See AVS for age appropriate recommendations  PHQ score 0 BP reviewed and at goal.   Considered the following items based upon USPSTF recommendations: Diabetes screening: HgA1c in 2020 was 5.4, no need to retest at this time Screening for elevated cholesterol: Last lipid panel in 2022, WNL HIV testing:  Negative in 2016 , will return for HIV testing at later visit Hepatitis C:  Negative in 2022 Syphilis if at high risk: discussed, will return for testing at later visit GC/CT  Will return for testing at later visit  Cervical cancer screening: prior Pap reviewed, repeat due in 2027 Breast cancer screening:  due in 2024 Colorectal cancer screening: discussed, colonoscopy ordered Vaccinations - states she can get Shingrix vaccine through her work.   Follow up when convenient for STD screening, pt is not symptomatic, does not have a new partner, just wants to be safe.     Precious Gilding, [place]

## 2022-07-01 ENCOUNTER — Encounter: Payer: Self-pay | Admitting: Student

## 2022-07-01 ENCOUNTER — Ambulatory Visit (INDEPENDENT_AMBULATORY_CARE_PROVIDER_SITE_OTHER): Payer: PRIVATE HEALTH INSURANCE | Admitting: Student

## 2022-07-01 ENCOUNTER — Other Ambulatory Visit: Payer: Self-pay

## 2022-07-01 VITALS — BP 135/78 | HR 83 | Wt 185.0 lb

## 2022-07-01 DIAGNOSIS — Z1211 Encounter for screening for malignant neoplasm of colon: Secondary | ICD-10-CM | POA: Diagnosis not present

## 2022-07-01 NOTE — Patient Instructions (Signed)
It was great to see you! Thank you for allowing me to participate in your care!   Our plans for today:  - You will be called to schedule your colonoscopy - Please make an appointment to schedule STD testing at your convenience if desired.  -Return in 6 months for a check up   Take care and seek immediate care sooner if you develop any concerns.   Dr. Precious Gilding, DO [facility]

## 2022-08-23 ENCOUNTER — Other Ambulatory Visit: Payer: Self-pay | Admitting: Physician Assistant

## 2022-09-05 ENCOUNTER — Other Ambulatory Visit: Payer: Self-pay | Admitting: Family Medicine

## 2022-09-05 ENCOUNTER — Other Ambulatory Visit: Payer: Self-pay | Admitting: Cardiology

## 2022-09-05 DIAGNOSIS — K2100 Gastro-esophageal reflux disease with esophagitis, without bleeding: Secondary | ICD-10-CM

## 2022-09-05 NOTE — Telephone Encounter (Signed)
No longer under provider care Requested Prescriptions  Pending Prescriptions Disp Refills   omeprazole (PRILOSEC) 20 MG capsule [Pharmacy Med Name: omeprazole 20 mg capsule,delayed release] 90 capsule 1    Sig: TAKE 1 CAPSULE BY MOUTH EVERY DAY AS NEEDED     There is no refill protocol information for this order

## 2022-10-17 ENCOUNTER — Other Ambulatory Visit: Payer: Self-pay | Admitting: Cardiology

## 2022-10-17 DIAGNOSIS — K2100 Gastro-esophageal reflux disease with esophagitis, without bleeding: Secondary | ICD-10-CM

## 2022-12-09 ENCOUNTER — Encounter: Payer: Self-pay | Admitting: Nurse Practitioner

## 2022-12-09 ENCOUNTER — Ambulatory Visit: Payer: PRIVATE HEALTH INSURANCE | Admitting: Nurse Practitioner

## 2022-12-09 VITALS — BP 146/88 | HR 86 | Temp 97.8°F

## 2022-12-09 DIAGNOSIS — Z789 Other specified health status: Secondary | ICD-10-CM

## 2022-12-09 DIAGNOSIS — R051 Acute cough: Secondary | ICD-10-CM

## 2022-12-09 MED ORDER — BENZONATATE 100 MG PO CAPS: 100.0000 mg | ORAL_CAPSULE | Freq: Two times a day (BID) | ORAL | 0 refills | Status: DC | PRN

## 2022-12-09 NOTE — Progress Notes (Signed)
Office Visit   Subjective:  Patient ID: Pamela Schneider, female    DOB: 1971-06-19  Age: 52 y.o. MRN: [id number]  CC: wellness exam and cough   HPI [name] presents for wellness exam visit for insurance benefit.  Patient has a PCP: Dr. Ronnald Ramp PMH significant for: HTN- follows cardiology  Last labs per PCP were completed: Feb 2023  Health Maintenance:  Colonoscopy: Due for this. Needs new referral. Mammogram: completed June 2023, repeat in 1 year PAP: April 2022    Smoker: never  Immunizations:  Shingrix-  Due for this. Would like to complete this.  COVID- x 4. Gets yearly flu.  Tdap-2018  Lifestyle: Diet- reports she cut back on a lot off things. Eats fruits and drinks plenty of water. Biggest meal is dinner for dinner where she eats 1 protein, veggie, and carb.  Exercise- Does not exercise. Pretty active at work.    Also mentions she has a cold/cough which stared 7 days ago, tested negative for COVID x3.  Enodrses cough and congestion. Denies fevers, SOB, chills, or muscle aches.  Has taken robutission DM, dayquil (x1) and some advil.     Past Medical History:  Diagnosis Date   CHEST PAIN, ATYPICAL 08/08/2010   Qualifier: Diagnosis of  By: Loraine Maple MD, Jacquelyn     Essential hypertension 12/31/2006   Qualifier: Diagnosis of  By: Beryle Lathe     History of anemia 03/12/2018   Hypertension     Past Surgical History:  Procedure Laterality Date   TUBAL LIGATION      Outpatient Medications Prior to Visit  Medication Sig Dispense Refill   candesartan (ATACAND) 32 MG tablet TAKE 1 TABLET BY MOUTH NIGHTLY AT BEDTIME 30 tablet 11   fluticasone (FLONASE) 50 MCG/ACT nasal spray Place 1 spray into both nostrils daily. 1 spray in each nostril every day (Patient taking differently: Place 1 spray into both nostrils as needed. 1 spray in each nostril as needed.) 16 g 5   furosemide (LASIX) 20 MG tablet TAKE 1 TABLET BY MOUTH DAILY AS NEEDED FOR fluid OR EDEMA  90 tablet 3   hydrALAZINE (APRESOLINE) 50 MG tablet TAKE 1 TABLET BY MOUTH 2 TIMES DAILY 180 tablet 2   indapamide (LOZOL) 2.5 MG tablet TAKE 2 TABLETS BY MOUTH EVERY DAY 180 tablet 2   Multiple Vitamin (MULTIVITAMIN WITH MINERALS) TABS tablet Take 1 tablet by mouth daily.     omeprazole (PRILOSEC) 20 MG capsule TAKE 1 CAPSULE BY MOUTH EVERY DAY AS NEEDED 90 capsule 3   spironolactone (ALDACTONE) 50 MG tablet TAKE 1 TABLET BY MOUTH NIGHTLY AT BEDTIME 90 tablet 11   No facility-administered medications prior to visit.    ROS Review of Systems  Constitutional:  Negative for chills, fatigue and fever.  HENT:  Positive for congestion and postnasal drip. Negative for ear pain, rhinorrhea, sinus pressure and sinus pain.   Eyes: Negative.   Respiratory:  Positive for cough. Negative for chest tightness, shortness of breath and wheezing.   Cardiovascular:  Negative for chest pain and leg swelling.  Gastrointestinal: Negative.   Musculoskeletal:  Negative for arthralgias and back pain.  Skin:        Still slight itching from abrasion site.   Neurological:  Negative for dizziness and headaches.    Objective:  BP (!) 146/88   Pulse 86   Temp 97.8 F (36.6 C)   LMP  (Within Days)   SpO2 100%   Physical Exam Constitutional:  General: She is not in acute distress. HENT:     Head: Normocephalic.     Right Ear: Tympanic membrane, ear canal and external ear normal.     Left Ear: Tympanic membrane, ear canal and external ear normal.     Nose: Nose normal.     Mouth/Throat:     Pharynx: Oropharynx is clear. Posterior oropharyngeal erythema present. No oropharyngeal exudate.  Eyes:     Pupils: Pupils are equal, round, and reactive to light.  Cardiovascular:     Rate and Rhythm: Normal rate and regular rhythm.     Heart sounds: Normal heart sounds.  Pulmonary:     Effort: Pulmonary effort is normal.     Breath sounds: Normal breath sounds.  Abdominal:     General: Bowel sounds are  normal.     Palpations: Abdomen is soft.  Musculoskeletal:        General: Normal range of motion.     Right lower leg: No edema.     Left lower leg: No edema.  Skin:    General: Skin is warm and dry.  Neurological:     General: No focal deficit present.     Mental Status: She is alert and oriented to person, place, and time.  Psychiatric:        Mood and Affect: Mood normal.        Behavior: Behavior normal.      Assessment & Plan:    Stacia was seen today for wellness exam.  Diagnoses and all orders for this visit:  Participant in health and wellness plan Adult wellness physical was conducted today. Importance of diet and exercise were discussed in detail.  We reviewed immunizations and gave recommendations regarding current immunization needed for age. Patient would like shingrix, will order this for her. Preventative health exams needed: Colonoscopy. Patient plans to discuss new referral with PCP.   Patient was advised yearly wellness exam and follow-up as needed.    Acute Cough Likely viral in nature. Discussed supportive care measures. Encouraged use of flonase and mucinex. Will also give tessalon perles. Discussed avoiding phenylephrine due to increased BP. Patient also encouraged to monitor BP readings at home and continue antihypertensives as prescribed.     No orders of the defined types were placed in this encounter.   Meds ordered this encounter  Medications   benzonatate (TESSALON) 100 MG capsule    Sig: Take 1 capsule (100 mg total) by mouth 2 (two) times daily as needed for cough.    Dispense:  20 capsule    Refill:  0    Follow-up: as needed.

## 2022-12-12 ENCOUNTER — Encounter: Payer: Self-pay | Admitting: Family Medicine

## 2022-12-12 ENCOUNTER — Other Ambulatory Visit: Payer: Self-pay

## 2022-12-12 ENCOUNTER — Ambulatory Visit (INDEPENDENT_AMBULATORY_CARE_PROVIDER_SITE_OTHER): Payer: PRIVATE HEALTH INSURANCE | Admitting: Family Medicine

## 2022-12-12 VITALS — BP 147/81 | HR 93 | Ht 61.0 in | Wt 183.2 lb

## 2022-12-12 DIAGNOSIS — Z1211 Encounter for screening for malignant neoplasm of colon: Secondary | ICD-10-CM

## 2022-12-12 DIAGNOSIS — J069 Acute upper respiratory infection, unspecified: Secondary | ICD-10-CM | POA: Diagnosis not present

## 2022-12-12 DIAGNOSIS — G8929 Other chronic pain: Secondary | ICD-10-CM

## 2022-12-12 NOTE — Progress Notes (Signed)
    SUBJECTIVE:   CHIEF COMPLAINT / HPI:   Cough Symptoms started 1/30 when she began to have sinus congestion.  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  She tested 3 times last week which was all negative for COVID.  Her congestion has now improved, though she has had a lingering cough that keeps her up at night.  She has brought up small amount of sputum at times with this cough.  She has been taking Tessalon Perles prescribed by her occupational NP as well as some over-the-counter cough medicines, but she has not been taking any doses.  Denies any fevers, nausea, vomiting, constipation, diarrhea, or body aches.  Cardiology follow up For difficult to control HTN in 2023. Last echo 2023 with mild septal hypertrophy. Due to repeat echo per cardiology. She has an appt in May for follow up.  Health maintenance  Would like colonoscopy referral today. She is going to get shingrix from her occupational NP.  PERTINENT  PMH / PSH: resistant HTN with LVH, GERD on PPI, bilateral OA  OBJECTIVE:   BP (!) 147/81   Pulse 93   Ht '5\' 1"'$  (1.549 m)   Wt 183 lb 3.2 oz (83.1 kg)   SpO2 100%   BMI 34.62 kg/m   General: Alert and oriented, in NAD HEENT: NCAT, EOM grossly normal, midline nasal septum Cardiac: RRR, no m/r/g appreciated on my exam Respiratory: CTAB, breathing and speaking comfortably on RA Extremities: Moves all extremities grossly equally Neurological: No gross focal deficit Psychiatric: Appropriate mood and affect  ASSESSMENT/PLAN:   Viral URI History and exam consistent with lingering cough after viral URI.  Explained to patient that cough can last for couple weeks after her initial URI symptoms improved.  Reassured by overall normal respiratory exam and lack of systemic symptoms.  Offered viral testing today which she declined given no change in management.  Recommended taking over-the-counter cough meds as directed on package, using honey and  warm tea, and allowing this to run its course.  Advised to come back for evaluation should she continue to worsen, have shortness of breath, or start to fever.  Health maintenance Referral sent for colonoscopy. She will get shingrix by her occupational NP.  Ethelene Hal, MD [facility]

## 2022-12-12 NOTE — Patient Instructions (Signed)
It was great to see you today! Here's what we talked about:  I believe your cough is lingering after a viral infection. Your cough can last for a couple weeks after your other symptoms improve. However, if you continue to worsen and start to have fevers and shortness of breath, please let us know. I have sent in a referral for you to get a colonoscopy. They will call you to schedule this.  Please let me know if you have any other questions.  Dr. Marcha Dutton

## 2022-12-15 MED ORDER — FLUTICASONE PROPIONATE 50 MCG/ACT NA SUSP: 1.0000 | Freq: Every day | NASAL | 5 refills | Status: DC

## 2022-12-17 ENCOUNTER — Other Ambulatory Visit: Payer: Self-pay | Admitting: Student

## 2022-12-17 DIAGNOSIS — R051 Acute cough: Secondary | ICD-10-CM

## 2022-12-18 ENCOUNTER — Other Ambulatory Visit: Payer: Self-pay | Admitting: Student

## 2022-12-18 DIAGNOSIS — G8929 Other chronic pain: Secondary | ICD-10-CM

## 2022-12-18 MED ORDER — FLUTICASONE PROPIONATE 50 MCG/ACT NA SUSP: 1.0000 | Freq: Every day | NASAL | 5 refills | Status: DC

## 2023-01-13 ENCOUNTER — Ambulatory Visit: Payer: PRIVATE HEALTH INSURANCE | Admitting: Nurse Practitioner

## 2023-01-13 DIAGNOSIS — Z23 Encounter for immunization: Secondary | ICD-10-CM

## 2023-01-13 NOTE — Progress Notes (Signed)
1st Dose of Shingrix given today. CDC VIS given prior to injection.  Tolerated well. No immediate adverse reactions noted. Educated and discussed red flag symptoms and to call 911 or go to emergency room with signs of anaphylaxis.  Take OTC tylenol as needed for expected side effects such as muscle aches or fever.  Patient given the chance to ask all questions and discussed answers.  RTC in 2 months for 2nd dose of shingrix vaccine to complete series, or sooner as needed.     

## 2023-01-16 ENCOUNTER — Other Ambulatory Visit: Payer: Self-pay | Admitting: Cardiology

## 2023-01-16 DIAGNOSIS — I10 Essential (primary) hypertension: Secondary | ICD-10-CM

## 2023-01-27 ENCOUNTER — Other Ambulatory Visit: Payer: Self-pay | Admitting: Nurse Practitioner

## 2023-01-27 DIAGNOSIS — Z1231 Encounter for screening mammogram for malignant neoplasm of breast: Secondary | ICD-10-CM

## 2023-01-27 NOTE — Progress Notes (Signed)
Patient is interested in obtaining screening mammogram via mobile mammogram bus at [facility]. No concerns at this time. Last mammogram: 04/29/22  Order placed.

## 2023-03-09 ENCOUNTER — Ambulatory Visit: Payer: PRIVATE HEALTH INSURANCE | Admitting: Cardiology

## 2023-03-10 DIAGNOSIS — M7989 Other specified soft tissue disorders: Secondary | ICD-10-CM | POA: Insufficient documentation

## 2023-03-10 NOTE — Progress Notes (Unsigned)
  Cardiology Office Note:   Date:  03/12/2023  ID:  Pamela Schneider, DOB Feb 01, 1971, MRN [id number]  History of Present Illness:   Pamela Schneider is a 52 y.o. female who was referred by Ronnald Ramp, MD for evaluation of difficult to control hypertension.  She has very strong family history of hypertension.  She has not always been particular compliant with medications.   She has had a work-up to include negative renal Dopplers. She had an echo in March and she had an EF of 55 - 60%.  She has moderate concentric hypertrophy.  There is been no clear secondary cause of the HTN.     Since I last saw her she has done well.  Blood pressure seems to be well-controlled like it is today.  She is not having lower extremity swelling that she was previously.  She did take Lasix occasionally. The patient denies any new symptoms such as chest discomfort, neck or arm discomfort. There has been no new shortness of breath, PND or orthopnea. There have been no reported palpitations, presyncope or syncope.  She has worked at [employer] for 22 years.   ROS: As stated in the HPI and negative for all other systems.  Studies Reviewed:    EKG: Sinus rhythm, rate 83, axis within normal limits, intervals within normal limits, no acute ST-T wave changes.   Risk Assessment/Calculations:              Physical Exam:   VS:  BP 122/74   Pulse 83   Ht 5\' 1"  (1.549 m)   Wt 183 lb 3.2 oz (83.1 kg)   SpO2 99%   BMI 34.62 kg/m    Wt Readings from Last 3 Encounters:  03/12/23 183 lb 3.2 oz (83.1 kg)  12/12/22 183 lb 3.2 oz (83.1 kg)  07/01/22 185 lb (83.9 kg)     GEN: Well nourished, well developed in no acute distress NECK: No JVD; No carotid bruits CARDIAC: RRR, no murmurs, rubs, gallops RESPIRATORY:  Clear to auscultation without rales, wheezing or rhonchi  ABDOMEN: Soft, non-tender, non-distended EXTREMITIES:  No edema; No deformity   ASSESSMENT AND PLAN:   HTN: Her blood pressure is  well-controlled.  No change in therapy.  She has not had blood work in quite a while so I will check c-Met, TSH and CBC.   LVH:     There is mild septal hypertrophy on echo in January 2023.  This is likely secondary to poorly controlled hypertension.  I will follow this up with an echo before I see her in a couple of years.  EDEMA: This is improved.  No change in therapy.      Signed, Rollene Rotunda, MD

## 2023-03-12 ENCOUNTER — Encounter: Payer: Self-pay | Admitting: Cardiology

## 2023-03-12 ENCOUNTER — Ambulatory Visit: Payer: PRIVATE HEALTH INSURANCE | Attending: Cardiology | Admitting: Cardiology

## 2023-03-12 VITALS — BP 122/74 | HR 83 | Ht 61.0 in | Wt 183.2 lb

## 2023-03-12 DIAGNOSIS — I10 Essential (primary) hypertension: Secondary | ICD-10-CM

## 2023-03-12 DIAGNOSIS — M7989 Other specified soft tissue disorders: Secondary | ICD-10-CM | POA: Diagnosis not present

## 2023-03-12 DIAGNOSIS — I517 Cardiomegaly: Secondary | ICD-10-CM | POA: Diagnosis not present

## 2023-03-12 NOTE — Patient Instructions (Addendum)
Medication Instructions:  Your physician recommends that you continue on your current medications as directed. Please refer to the Current Medication list given to you today.  *If you need a refill on your cardiac medications before your next appointment, please call your pharmacy*   Lab Work: Your physician recommends that you have labs drawn today: CMET, CBC & TSH  If you have labs (blood work) drawn today and your tests are completely normal, you will receive your results only by: MyChart Message (if you have MyChart) OR A paper copy in the mail If you have any lab test that is abnormal or we need to change your treatment, we will call you to review the results.   Follow-Up: At [facility], you and your health needs are our priority.  As part of our continuing mission to provide you with exceptional heart care, we have created designated Provider Care Teams.  These Care Teams include your primary Cardiologist (physician) and Advanced Practice Providers (APPs -  Physician Assistants and Nurse Practitioners) who all work together to provide you with the care you need, when you need it.  We recommend signing up for the patient portal called "MyChart".  Sign up information is provided on this After Visit Summary.  MyChart is used to connect with patients for Virtual Visits (Telemedicine).  Patients are able to view lab/test results, encounter notes, upcoming appointments, etc.  Non-urgent messages can be sent to your provider as well.   To learn more about what you can do with MyChart, go to [email or web address].    Your next appointment:   2 year(s)  Provider:   Rollene Rotunda, MD

## 2023-03-13 LAB — COMPREHENSIVE METABOLIC PANEL
ALT: 30 IU/L (ref 0–32)
AST: 27 IU/L (ref 0–40)
Albumin/Globulin Ratio: 1.2 (ref 1.2–2.2)
Albumin: 4.2 g/dL (ref 3.8–4.9)
Alkaline Phosphatase: 56 IU/L (ref 44–121)
BUN/Creatinine Ratio: 25 — ABNORMAL HIGH (ref 9–23)
BUN: 32 mg/dL — ABNORMAL HIGH (ref 6–24)
Bilirubin Total: 0.2 mg/dL (ref 0.0–1.2)
CO2: 24 mmol/L (ref 20–29)
Calcium: 9.4 mg/dL (ref 8.7–10.2)
Chloride: 99 mmol/L (ref 96–106)
Creatinine, Ser: 1.26 mg/dL — ABNORMAL HIGH (ref 0.57–1.00)
Globulin, Total: 3.6 g/dL (ref 1.5–4.5)
Glucose: 92 mg/dL (ref 70–99)
Potassium: 4 mmol/L (ref 3.5–5.2)
Sodium: 137 mmol/L (ref 134–144)
Total Protein: 7.8 g/dL (ref 6.0–8.5)
eGFR: 51 mL/min/{1.73_m2} — ABNORMAL LOW (ref >59)

## 2023-03-13 LAB — TSH: TSH: 2.15 u[IU]/mL (ref 0.450–4.500)

## 2023-03-13 LAB — CBC
Hematocrit: 35.6 % (ref 34.0–46.6)
Hemoglobin: 10.9 g/dL — ABNORMAL LOW (ref 11.1–15.9)
MCH: 22.1 pg — ABNORMAL LOW (ref 26.6–33.0)
MCHC: 30.6 g/dL — ABNORMAL LOW (ref 31.5–35.7)
MCV: 72 fL — ABNORMAL LOW (ref 79–97)
Platelets: 233 10*3/uL (ref 150–450)
RBC: 4.94 x10E6/uL (ref 3.77–5.28)
RDW: 15.4 % (ref 11.7–15.4)
WBC: 3.8 10*3/uL (ref 3.4–10.8)

## 2023-03-19 ENCOUNTER — Encounter: Payer: Self-pay | Admitting: *Deleted

## 2023-03-26 ENCOUNTER — Ambulatory Visit: Payer: PRIVATE HEALTH INSURANCE | Admitting: Nurse Practitioner

## 2023-04-02 ENCOUNTER — Ambulatory Visit: Payer: PRIVATE HEALTH INSURANCE | Admitting: Nurse Practitioner

## 2023-05-01 ENCOUNTER — Other Ambulatory Visit: Payer: Self-pay | Admitting: Cardiology

## 2023-05-01 DIAGNOSIS — K2100 Gastro-esophageal reflux disease with esophagitis, without bleeding: Secondary | ICD-10-CM

## 2023-05-06 ENCOUNTER — Ambulatory Visit
Admission: RE | Admit: 2023-05-06 | Discharge: 2023-05-06 | Disposition: A | Discharge status: Home / Self Care | Payer: PRIVATE HEALTH INSURANCE | Source: Ambulatory Visit | Attending: Nurse Practitioner | Admitting: Nurse Practitioner

## 2023-05-06 DIAGNOSIS — Z1231 Encounter for screening mammogram for malignant neoplasm of breast: Secondary | ICD-10-CM

## 2023-07-07 ENCOUNTER — Ambulatory Visit: Payer: PRIVATE HEALTH INSURANCE | Admitting: Nurse Practitioner

## 2023-07-07 NOTE — Progress Notes (Deleted)
   Patient declines second shingrix vaccine at this time.

## 2023-07-16 ENCOUNTER — Encounter: Payer: Self-pay | Admitting: Nurse Practitioner

## 2023-07-16 ENCOUNTER — Ambulatory Visit: Payer: PRIVATE HEALTH INSURANCE | Admitting: Nurse Practitioner

## 2023-07-16 VITALS — BP 152/88 | HR 92

## 2023-07-16 DIAGNOSIS — I10 Essential (primary) hypertension: Secondary | ICD-10-CM

## 2023-07-16 DIAGNOSIS — G8929 Other chronic pain: Secondary | ICD-10-CM

## 2023-07-16 MED ORDER — MELOXICAM 7.5 MG PO TABS
7.5000 mg | ORAL_TABLET | Freq: Every day | ORAL | 0 refills | Status: DC
Start: 2023-07-16 — End: 2023-07-29

## 2023-07-16 NOTE — Progress Notes (Signed)
Acute Office Visit  Subjective:     Patient ID: Pamela Schneider, female    DOB: 07-Dec-1970, 52 y.o.   MRN: [id number]  Chief Complaint  Patient presents with   Bilateral knee pain    HPI Patient is in today for bilateral knee pain. Endorses Hx of arthritis.  Reports pain has been present x 2 weeks. Started intermittently but has  now become constant. Denies injury, trauma, or falls. Reports pain is bearable 5/10.  Has been using some otc tylenol cream to site. Also endorses taking advil yesterday which seemed to help.  Reports pain is worse at night.   Last knee x-rays in 2019 revealed degenerative changes.  Patient BP is elevated today, reports she has not taken her BP meds this morning but endorses adherence to medications.   Review of Systems  Constitutional:  Negative for chills and fever.  Musculoskeletal:  Positive for joint pain. Negative for falls and myalgias.  Neurological:  Negative for weakness.        Objective:    BP (!) 152/88 (BP Location: Right Arm, Patient Position: Sitting)   Pulse 92   SpO2 99%    Physical Exam Constitutional:      General: She is not in acute distress. Cardiovascular:     Rate and Rhythm: Normal rate and regular rhythm.     Heart sounds: Normal heart sounds.  Pulmonary:     Effort: Pulmonary effort is normal.     Breath sounds: Normal breath sounds.  Musculoskeletal:        General: Tenderness (slight tenderness on palpation of left knee) present. No swelling.     Right lower leg: No edema.     Left lower leg: No edema.  Neurological:     Mental Status: She is alert.     No results found for any visits on 07/16/23.      Assessment & Plan:   Problem List Items Addressed This Visit   None Visit Diagnoses     Chronic pain of both knees    -  Primary   Relevant Medications   meloxicam (MOBIC) 7.5 MG tablet Meloxicam 7.5mg  daily x 2 weeks. Can take tylenol PRN as needed. Discussed avoiding other otc NSAIDS while on  meloxicam. Take with a meal. Consider repeat imaging with PCP during next visit in 2 weeks.    Primary hypertension      Discussed BP goal less than 130/80. Monitor BP at home while taking meloxicam. Continue taking medications as prescribed.      Meds ordered this encounter  Medications   meloxicam (MOBIC) 7.5 MG tablet    Sig: Take 1 tablet (7.5 mg total) by mouth daily for 15 days.    Dispense:  15 tablet    Refill:  0    Order Specific Question:   Supervising Provider    Answer:   Erasmo Downer [[id number]]   Follow-up as needed.  Gloris Ham, NP

## 2023-07-22 ENCOUNTER — Encounter: Payer: Self-pay | Admitting: Pharmacist

## 2023-07-29 ENCOUNTER — Encounter: Payer: Self-pay | Admitting: Student

## 2023-07-29 ENCOUNTER — Ambulatory Visit: Payer: PRIVATE HEALTH INSURANCE | Admitting: Student

## 2023-07-29 VITALS — BP 152/83 | HR 81 | Ht 61.0 in | Wt 182.5 lb

## 2023-07-29 DIAGNOSIS — D649 Anemia, unspecified: Secondary | ICD-10-CM

## 2023-07-29 DIAGNOSIS — R748 Abnormal levels of other serum enzymes: Secondary | ICD-10-CM

## 2023-07-29 DIAGNOSIS — Z0000 Encounter for general adult medical examination without abnormal findings: Secondary | ICD-10-CM

## 2023-07-29 DIAGNOSIS — Z1211 Encounter for screening for malignant neoplasm of colon: Secondary | ICD-10-CM | POA: Diagnosis not present

## 2023-07-29 DIAGNOSIS — I1A0 Resistant hypertension: Secondary | ICD-10-CM

## 2023-07-29 DIAGNOSIS — Z113 Encounter for screening for infections with a predominantly sexual mode of transmission: Secondary | ICD-10-CM

## 2023-07-29 DIAGNOSIS — M25562 Pain in left knee: Secondary | ICD-10-CM

## 2023-07-29 DIAGNOSIS — M25561 Pain in right knee: Secondary | ICD-10-CM

## 2023-07-29 MED ORDER — DICLOFENAC SODIUM 1 % EX GEL
4.0000 g | Freq: Four times a day (QID) | CUTANEOUS | 0 refills | Status: AC
Start: 2023-07-29 — End: ?

## 2023-07-29 NOTE — Assessment & Plan Note (Signed)
Bilateral knee x-rays in 2019 showed moderate degenerative changes.  Can consider OA as cause of knee pain.  Reassuringly, pain is improved with Voltaren gel and she denies pain today. -Rx Voltaren gel -Return for further evaluation if pain worsens

## 2023-07-29 NOTE — Assessment & Plan Note (Addendum)
Also following up with cardiology for HTN.  Uncontrolled in office today but patient states BP has been well-controlled at home.  Could be whitecoat hypertension.  Shared decision making used, patient prefers to check her BP at home and will return if it is consistently above 140/90. -Continue current regimen -Can consider referral to Dr. Raymondo Band for ambulatory blood pressure monitoring

## 2023-07-29 NOTE — Patient Instructions (Addendum)
It was great to see you! Thank you for allowing me to participate in your care!  I recommend that you always bring your medications to each appointment as this makes it easy to ensure you are on the correct medications and helps [place] not miss when refills are needed.  Our plans for today:  -Voltaren, apply to bilateral knees up to 4 times a day as needed.  Return to further discuss if knee pain does not improve -I referred you to gastroenterology for colonoscopy.  They will call you to schedule an appointment -Check BP at home, if consistently >140/90, please let me know  We are checking some labs today, I will call you if they are abnormal will send you a MyChart message or a letter if they are normal.  If you do not hear about your labs in the next 2 weeks please let us know.  Take care and seek immediate care sooner if you develop any concerns.   Dr. [name], DO [facility]

## 2023-07-29 NOTE — Progress Notes (Signed)
    SUBJECTIVE:   Chief compliant/HPI: annual examination  Pamela Schneider is a 52 y.o. who presents today for an annual exam.   HTN Taking Candesartan 32 mg daily, Hydralazine 50 mg BID, indapamide 5 mg daily, spironolactone 50 mg daily.  Patient states BP has been well-controlled at home, running < 140 systolic. Feeling well today.   Anemia Labs from 03/12/23 show microcytic anemia: hgb 10.9, MCV MCV 72 No blood in stool LMP over a year ago Not vegetarian  Elevated Cr Cr elevated to 1.28 last year>1.26 in May with GFR 51.  No formal diagnosis of CKD.  Bilateral knee pain Intermittent for past month, worse when BLE edema is bad and improves after using Lasix.  Recently saw NP at work who prescribed meloxicam, did not help. Voltaren gel helps some, requests prescription.  No knee pain today.  History tabs reviewed and updated.    OBJECTIVE:   BP (!) 152/83   Pulse 81   Ht 5\' 1"  (1.549 m)   Wt 182 lb 8 oz (82.8 kg)   LMP 02/01/2021 Comment: Patient states she hasnt had a period since her last physical  SpO2 100%   BMI 34.48 kg/m    General: 52 year old female, pleasant, NAD HEENT: White sclera, clear conjunctiva, MMM, good dentition Cardio: RRR, normal S1/S2 Lungs: CTAB, normal effort Abdomen: Soft, nontender palpation, nondistended Extremities: No edema BLEs MSK: No deformity or edema of bilateral knees, no TTP, good ROM with flexion and extension Neuro: Alert, no focal deficits Psych: Mood and affect appropriate for situation  ASSESSMENT/PLAN:   Resistant hypertension Also following up with cardiology for HTN.  Uncontrolled in office today but patient states BP has been well-controlled at home.  Could be whitecoat hypertension.  Shared decision making used, patient prefers to check her BP at home and will return if it is consistently above 140/90. -Continue current regimen -Can consider referral to Dr. Raymondo Band for ambulatory blood pressure  monitoring  Anemia Mild microcytic anemia on labs 4 months ago.  Never had colonoscopy, no longer having periods. -CBC, ferritin, iron, TIBC -GI referral for colonoscopy  Elevated creatine kinase No formal diagnosis of CKD but creatinine has been elevated last 2 years with decreased GFR.  Can consider uncontrolled hypertension as cause of likely CKD. -BMP -UACR -Can consider renal ultrasound  Pain in both knees Bilateral knee x-rays in 2019 showed moderate degenerative changes.  Can consider OA as cause of knee pain.  Reassuringly, pain is improved with Voltaren gel and she denies pain today. -Rx Voltaren gel -Return for further evaluation if pain worsens    Annual Examination  See AVS for age appropriate recommendations  PHQ score 0 Denies intimate partner violence   Considered the following items based upon USPSTF recommendations: Diabetes screening: discussed- A1c 5.4 in 2020 Screening for elevated cholesterol:  Lipid panel 2022 looked great (total cholesterol 142, LDL 51, HDL 78).  Can recheck in a couple years if desired HIV testing:  ordered Hepatitis C:  Neg in 2022 Hepatitis B: discussed Syphilis if at high risk: ordered GC/CT  Plan to do this at future visit: no concerning symptoms .  Reviewed risk factors for latent tuberculosis and declined  Cervical cancer screening:  Due in 2027 Breast cancer screening:  UTD Colorectal cancer screening:  Ordered Vaccinations declines.   Follow-up based on lab results   [name], DO [facility]

## 2023-07-29 NOTE — Assessment & Plan Note (Signed)
No formal diagnosis of CKD but creatinine has been elevated last 2 years with decreased GFR.  Can consider uncontrolled hypertension as cause of likely CKD. -BMP -UACR -Can consider renal ultrasound

## 2023-07-29 NOTE — Assessment & Plan Note (Signed)
Mild microcytic anemia on labs 4 months ago.  Never had colonoscopy, no longer having periods. -CBC, ferritin, iron, TIBC -GI referral for colonoscopy

## 2023-07-30 LAB — BASIC METABOLIC PANEL
BUN/Creatinine Ratio: 22 (ref 9–23)
BUN: 27 mg/dL — ABNORMAL HIGH (ref 6–24)
CO2: 24 mmol/L (ref 20–29)
Calcium: 10.2 mg/dL (ref 8.7–10.2)
Chloride: 98 mmol/L (ref 96–106)
Creatinine, Ser: 1.22 mg/dL — ABNORMAL HIGH (ref 0.57–1.00)
Glucose: 109 mg/dL — ABNORMAL HIGH (ref 70–99)
Potassium: 4 mmol/L (ref 3.5–5.2)
Sodium: 139 mmol/L (ref 134–144)
eGFR: 53 mL/min/{1.73_m2} — ABNORMAL LOW (ref >59)

## 2023-07-30 LAB — CBC WITH DIFFERENTIAL/PLATELET
Basophils Absolute: 0 10*3/uL (ref 0.0–0.2)
Basos: 0 %
EOS (ABSOLUTE): 0.1 10*3/uL (ref 0.0–0.4)
Eos: 4 %
Hematocrit: 36.4 % (ref 34.0–46.6)
Hemoglobin: 10.9 g/dL — ABNORMAL LOW (ref 11.1–15.9)
Immature Grans (Abs): 0 10*3/uL (ref 0.0–0.1)
Immature Granulocytes: 0 %
Lymphocytes Absolute: 1.1 10*3/uL (ref 0.7–3.1)
Lymphs: 28 %
MCH: 21.6 pg — ABNORMAL LOW (ref 26.6–33.0)
MCHC: 29.9 g/dL — ABNORMAL LOW (ref 31.5–35.7)
MCV: 72 fL — ABNORMAL LOW (ref 79–97)
Monocytes Absolute: 0.2 10*3/uL (ref 0.1–0.9)
Monocytes: 5 %
Neutrophils Absolute: 2.5 10*3/uL (ref 1.4–7.0)
Neutrophils: 63 %
Platelets: 236 10*3/uL (ref 150–450)
RBC: 5.04 x10E6/uL (ref 3.77–5.28)
RDW: 15.8 % — ABNORMAL HIGH (ref 11.7–15.4)
WBC: 4 10*3/uL (ref 3.4–10.8)

## 2023-07-30 LAB — IRON,TIBC AND FERRITIN PANEL
Ferritin: 63 ng/mL (ref 15–150)
Iron Saturation: 19 % (ref 15–55)
Iron: 73 ug/dL (ref 27–159)
Total Iron Binding Capacity: 381 ug/dL (ref 250–450)
UIBC: 308 ug/dL (ref 131–425)

## 2023-07-30 LAB — MICROALBUMIN / CREATININE URINE RATIO
Creatinine, Urine: 18.4 mg/dL
Microalb/Creat Ratio: <16 mg/g creat (ref 0–29)
Microalbumin, Urine: <3 ug/mL

## 2023-07-30 LAB — RPR: RPR Ser Ql: NONREACTIVE

## 2023-07-30 LAB — HIV ANTIBODY (ROUTINE TESTING W REFLEX): HIV Screen 4th Generation wRfx: NONREACTIVE

## 2023-07-31 ENCOUNTER — Telehealth: Payer: Self-pay | Admitting: Student

## 2023-07-31 NOTE — Telephone Encounter (Signed)
Called pt to discuss labs  Kidney function stable, will officially give diagnosis of CKD 3a.  Reassuringly UmACR was WNL.  Can consider getting renal ultrasound and starting SGLT2  Still has a microcytic anemia although labs do not indicate iron deficiency anemia.  Can consider anemia of chronic disease or thalassemia.  Discussed that we could explore this further with hemoglobin electrophoresis to r/o thalassemia.  Patient agrees to return next month for blood pressure follow-up and consider getting additional labs

## 2023-08-07 ENCOUNTER — Other Ambulatory Visit (HOSPITAL_COMMUNITY): Payer: Self-pay

## 2023-08-08 ENCOUNTER — Other Ambulatory Visit: Payer: Self-pay | Admitting: Cardiology

## 2023-08-20 NOTE — Progress Notes (Signed)
    SUBJECTIVE:   CHIEF COMPLAINT / HPI:   HTN Has been checking BP at home - has been in 130's/80's. Took BP meds today - hydralyzine 50 mg BID, candesartan 32 mg daily, indapamide 2.5 mg daily and spironolactone daily.  States she is feeling well today, denies any chest pain, shortness of breath, changes in vision  CKD 3a Currently on candesartan. Last BMP on 07/29/2023 with creatinine 1.22 GFR 53.  umACR WNL.  Anemia  Microcytic anemia with hemoglobin 10.9.  Denies any blood in stool, LMP over a year ago.  Referral placed at last visit for colonoscopy.  PERTINENT  PMH / PSH: Resistant hypertension, LVH, anemia, CKD 3a  OBJECTIVE:   BP (!) 155/91   Pulse 88   Ht 5\' 1"  (1.549 m)   Wt 180 lb 3.2 oz (81.7 kg)   LMP 02/01/2021 Comment: Patient states she hasnt had a period since her last physical  SpO2 100%   BMI 34.05 kg/m    General: NAD, pleasant Cardiac: RRR, no murmurs. Respiratory: CTAB, normal effort, No wheezes, rales or rhonchi Skin: warm and dry Neuro: alert, no obvious focal deficits Psych: Normal affect and mood  ASSESSMENT/PLAN:   Resistant hypertension Uncontrolled office visit today but home readings have been at goal.  Reassuringly, she is feeling well. -Continue current regimen, continue to check blood pressure daily -Patient to schedule appointment ASAP with Dr. Raymondo Band for ambulatory blood pressure monitoring  Stage 3a chronic kidney disease (HCC) Likely related to resistant hypertension.  -renal ultrasound for further evaluation -Continue ARB -Rx Jardiance to prevent further progression of CKD  Anemia Likely IDA as iron saturation was on lower end at 19.   -Rx iron supplement -Increase iron in diet -Can recheck labs to ensure she is responding in a few months -Patient advised to schedule colonoscopy     Dr. [name], DO [facility]

## 2023-08-21 ENCOUNTER — Ambulatory Visit (INDEPENDENT_AMBULATORY_CARE_PROVIDER_SITE_OTHER): Payer: PRIVATE HEALTH INSURANCE | Admitting: Student

## 2023-08-21 ENCOUNTER — Encounter: Payer: Self-pay | Admitting: Student

## 2023-08-21 VITALS — BP 155/91 | HR 88 | Ht 61.0 in | Wt 180.2 lb

## 2023-08-21 DIAGNOSIS — D509 Iron deficiency anemia, unspecified: Secondary | ICD-10-CM | POA: Diagnosis not present

## 2023-08-21 DIAGNOSIS — I1A0 Resistant hypertension: Secondary | ICD-10-CM | POA: Diagnosis not present

## 2023-08-21 DIAGNOSIS — N1831 Chronic kidney disease, stage 3a: Secondary | ICD-10-CM | POA: Diagnosis not present

## 2023-08-21 MED ORDER — FERROUS SULFATE 325 (65 FE) MG PO TABS
325.0000 mg | ORAL_TABLET | Freq: Every day | ORAL | 3 refills | Status: DC
Start: 2023-08-21 — End: 2024-09-16

## 2023-08-21 MED ORDER — EMPAGLIFLOZIN 10 MG PO TABS
10.0000 mg | ORAL_TABLET | Freq: Every day | ORAL | 3 refills | Status: DC
Start: 2023-08-21 — End: 2024-08-22

## 2023-08-21 NOTE — Assessment & Plan Note (Addendum)
Uncontrolled office visit today but home readings have been at goal.  Reassuringly, she is feeling well. -Continue current regimen, continue to check blood pressure daily -Patient to schedule appointment ASAP with Dr. Raymondo Band for ambulatory blood pressure monitoring

## 2023-08-21 NOTE — Assessment & Plan Note (Signed)
Likely related to resistant hypertension.  -renal ultrasound for further evaluation -Continue ARB -Rx Jardiance to prevent further progression of CKD

## 2023-08-21 NOTE — Assessment & Plan Note (Addendum)
Likely IDA as iron saturation was on lower end at 19.   -Rx iron supplement -Increase iron in diet -Can recheck labs to ensure she is responding in a few months -Patient advised to schedule colonoscopy

## 2023-08-21 NOTE — Patient Instructions (Signed)
It was great to see you! Thank you for allowing me to participate in your care!  I recommend that you always bring your medications to each appointment as this makes it easy to ensure you are on the correct medications and helps [place] not miss when refills are needed.  Our plans for today:  - Call [facility] to schedule colonoscopy Address: [address], [place], [place] [address] Phone: [phone] - Jardiance and iron sent to pharmacy  - Go to renal ultrasound -schedule apt with Dr. Raymondo Band for BP  Take care and seek immediate care sooner if you develop any concerns.   Dr. [name], DO [facility]

## 2023-09-22 ENCOUNTER — Other Ambulatory Visit: Payer: Self-pay | Admitting: Cardiology

## 2023-09-22 DIAGNOSIS — K2100 Gastro-esophageal reflux disease with esophagitis, without bleeding: Secondary | ICD-10-CM

## 2023-10-07 ENCOUNTER — Ambulatory Visit: Payer: PRIVATE HEALTH INSURANCE | Admitting: Pharmacist

## 2023-10-15 ENCOUNTER — Ambulatory Visit: Payer: PRIVATE HEALTH INSURANCE | Admitting: Pharmacist

## 2023-10-15 ENCOUNTER — Encounter: Payer: Self-pay | Admitting: Nurse Practitioner

## 2023-10-15 ENCOUNTER — Ambulatory Visit: Payer: PRIVATE HEALTH INSURANCE | Admitting: Nurse Practitioner

## 2023-10-15 VITALS — BP 148/84 | HR 92 | Temp 98.5°F

## 2023-10-15 DIAGNOSIS — R051 Acute cough: Secondary | ICD-10-CM

## 2023-10-15 DIAGNOSIS — I10 Essential (primary) hypertension: Secondary | ICD-10-CM

## 2023-10-15 MED ORDER — BENZONATATE 100 MG PO CAPS
100.0000 mg | ORAL_CAPSULE | Freq: Two times a day (BID) | ORAL | 0 refills | Status: DC | PRN
Start: 2023-10-15 — End: 2023-10-15

## 2023-10-15 MED ORDER — BENZONATATE 100 MG PO CAPS
100.0000 mg | ORAL_CAPSULE | Freq: Two times a day (BID) | ORAL | 1 refills | Status: DC | PRN
Start: 2023-10-15 — End: 2024-01-05

## 2023-10-15 NOTE — Progress Notes (Signed)
Acute Office Visit  Subjective:     Patient ID: Pamela Schneider, female    DOB: 05-06-1971, 52 y.o.   MRN: [id number]  Chief Complaint  Patient presents with   Cough   Patient presents today for c/o of cough. Started 2 weeks ago, right after thanksgiving. Patient reports her great niece (51 year old) had cough at that time, and was later diagnosed with RSV.   Reports cough which initially was very productive (now dry) and congestion. Reports slightly itchy throat.  Denies fever, chills, malaise, sinus pain, nasal drainage, or shortness of breath.   Did take mucinex fast max- for more than a week, last dose was Sunday.  Cough is present both day and night. Nothing seems to make it better or worse.   Review of Systems  Constitutional:  Negative for chills, fever and malaise/fatigue.  HENT:  Positive for congestion. Negative for ear discharge, ear pain, sinus pain and sore throat.   Respiratory:  Positive for cough and sputum production. Negative for shortness of breath and wheezing.   Cardiovascular:  Negative for chest pain.  Gastrointestinal:  Negative for constipation, diarrhea, nausea and vomiting.  Musculoskeletal:  Negative for myalgias.  Neurological:  Negative for weakness and headaches.  Psychiatric/Behavioral:  The patient does not have insomnia.         Objective:    BP (!) 148/84   Pulse 92   Temp 98.5 F (36.9 C)   SpO2 99%    Physical Exam Constitutional:      General: She is not in acute distress. HENT:     Head: Normocephalic.     Right Ear: Tympanic membrane, ear canal and external ear normal.     Left Ear: Tympanic membrane, ear canal and external ear normal.     Nose: Nose normal. No congestion.     Right Sinus: No maxillary sinus tenderness or frontal sinus tenderness.     Left Sinus: No maxillary sinus tenderness or frontal sinus tenderness.     Mouth/Throat:     Mouth: Mucous membranes are moist.     Pharynx: Posterior oropharyngeal erythema  (slight) present. No oropharyngeal exudate.  Eyes:     Pupils: Pupils are equal, round, and reactive to light.  Cardiovascular:     Rate and Rhythm: Normal rate and regular rhythm.     Heart sounds: Normal heart sounds.  Pulmonary:     Effort: Pulmonary effort is normal. No respiratory distress.     Breath sounds: Normal breath sounds. No wheezing.  Musculoskeletal:     Right lower leg: No edema.     Left lower leg: No edema.  Neurological:     Mental Status: She is alert.     No results found for any visits on 10/15/23.      Assessment & Plan:   Problem List Items Addressed This Visit   None Visit Diagnoses       Acute cough    -  Primary Suspect viral in nature. Patient is hemodynamically stable today. Encouraged supportive care measures, including tessalon perles as needed. Stop mucinex fast max as this contains phenylephrine, can opt for plain guaifenesin.  Follow-up if symptoms worsen or fail to improve.    Relevant Medications   benzonatate (TESSALON) 100 MG capsule     Primary hypertension      BP is elevated today. Encouraged to monitor readings as well as follow-up with PCP as plan is to obtain 24 blood pressure readings. Also encouraged monitoring  for signs of fluid overload.      Follow-up as needed.  Gloris Ham, NP

## 2023-11-21 ENCOUNTER — Other Ambulatory Visit: Payer: Self-pay | Admitting: Cardiology

## 2023-12-11 ENCOUNTER — Other Ambulatory Visit: Payer: Self-pay | Admitting: Cardiology

## 2024-01-05 ENCOUNTER — Encounter: Payer: Self-pay | Admitting: Nurse Practitioner

## 2024-01-05 ENCOUNTER — Ambulatory Visit: Payer: PRIVATE HEALTH INSURANCE | Admitting: Nurse Practitioner

## 2024-01-05 VITALS — BP 160/92 | HR 101 | Temp 97.5°F

## 2024-01-05 DIAGNOSIS — J0110 Acute frontal sinusitis, unspecified: Secondary | ICD-10-CM

## 2024-01-05 DIAGNOSIS — I10 Essential (primary) hypertension: Secondary | ICD-10-CM

## 2024-01-05 MED ORDER — BENZONATATE 200 MG PO CAPS
200.0000 mg | ORAL_CAPSULE | Freq: Two times a day (BID) | ORAL | 0 refills | Status: DC | PRN
Start: 2024-01-05 — End: 2024-05-17

## 2024-01-05 MED ORDER — AMOXICILLIN-POT CLAVULANATE 875-125 MG PO TABS
1.0000 | ORAL_TABLET | Freq: Two times a day (BID) | ORAL | 0 refills | Status: DC
Start: 2024-01-05 — End: 2024-05-17

## 2024-01-05 NOTE — Progress Notes (Signed)
 Acute Office Visit  Subjective:     Patient ID: Pamela Schneider, female    DOB: [birth year], 53 y.o.   MRN: [id number]  Chief Complaint  Patient presents with   Sinusitis     Patient is in today for congestion and sinus pain, started 6 days ago. Endorses non productive cough, drainage. Some headaches and ear "popping"  Denies fevers or chills.  Started flonase this morning. Also took mucinex which she didn't find very helpful. Admits to taking sudafed to relieve symptoms although she is aware that this can increase her blood pressure.   Review of Systems  Constitutional:  Negative for chills and fever.  HENT:  Positive for congestion and sinus pain. Negative for ear pain and sore throat.   Respiratory:  Positive for cough. Negative for sputum production and shortness of breath.   Cardiovascular:  Negative for chest pain.  Musculoskeletal:  Negative for myalgias.  Neurological:  Positive for headaches.        Objective:    BP (!) 160/92 (BP Location: Left Arm, Patient Position: Sitting, Cuff Size: Normal)   Pulse (!) 101   Temp (!) 97.5 F (36.4 C)   SpO2 99%    Physical Exam Constitutional:      General: She is not in acute distress. HENT:     Head: Normocephalic.     Right Ear: Tympanic membrane, ear canal and external ear normal.     Left Ear: Tympanic membrane, ear canal and external ear normal.     Nose:     Right Sinus: No maxillary sinus tenderness or frontal sinus tenderness.     Left Sinus: No maxillary sinus tenderness or frontal sinus tenderness.     Mouth/Throat:     Pharynx: No oropharyngeal exudate.  Eyes:     Pupils: Pupils are equal, round, and reactive to light.  Cardiovascular:     Rate and Rhythm: Normal rate and regular rhythm.     Heart sounds: Normal heart sounds.  Pulmonary:     Effort: Pulmonary effort is normal.     Breath sounds: Normal breath sounds.  Musculoskeletal:        General: Normal range of motion.  Neurological:      General: No focal deficit present.     Mental Status: She is alert and oriented to person, place, and time.     No results found for any visits on 01/05/24.      Assessment & Plan:   Problem List Items Addressed This Visit   None Visit Diagnoses       Acute non-recurrent frontal sinusitis    -  Primary Recommended supportive care measures including using flonase twice daily, tessalon perles for cough. However, if symptoms persist or fail to improve in 3-4 days, she can start antibiotic treatment which had been already sent to her pharmacy    Relevant Medications   benzonatate (TESSALON) 200 MG capsule   amoxicillin-clavulanate (AUGMENTIN) 875-125 MG tablet     Primary hypertension      BP is elevated today. Avoid sudafed. Continue to monitor BP readings at home and follow-up if readings don't improve once symptoms subside or if readings continue to worsen.       Meds ordered this encounter  Medications   benzonatate (TESSALON) 200 MG capsule    Sig: Take 1 capsule (200 mg total) by mouth 2 (two) times daily as needed for cough.    Dispense:  20 capsule    Refill:  0  Supervising Provider:   Erasmo Downer [[id number]]   amoxicillin-clavulanate (AUGMENTIN) 875-125 MG tablet    Sig: Take 1 tablet by mouth 2 (two) times daily.    Dispense:  20 tablet    Refill:  0    Supervising Provider:   Erasmo Downer [[id number]]    As needed.  Gloris Ham, NP

## 2024-01-09 ENCOUNTER — Other Ambulatory Visit: Payer: Self-pay | Admitting: Cardiology

## 2024-01-09 DIAGNOSIS — I10 Essential (primary) hypertension: Secondary | ICD-10-CM

## 2024-03-15 ENCOUNTER — Other Ambulatory Visit: Payer: Self-pay | Admitting: Nurse Practitioner

## 2024-03-15 DIAGNOSIS — Z1231 Encounter for screening mammogram for malignant neoplasm of breast: Secondary | ICD-10-CM

## 2024-03-15 NOTE — Progress Notes (Signed)
 Patient is interested in obtaining screening mammogram via mobile mammogram bus at [facility]. No concerns at this time. Last mammogram:05/05/23  Order placed.

## 2024-03-22 ENCOUNTER — Ambulatory Visit: Payer: PRIVATE HEALTH INSURANCE | Admitting: Student

## 2024-03-22 NOTE — Progress Notes (Deleted)
 Pamela Schneider, 53 y.o. female, presents for wellness exam for insurance benefit PCP: Glenn Lange, DO   Last Labs on 07/29/2023 by PCP. Resistant HTN. CKD, and anemia are managed by PCP. No complaints today.   Vaccinations Immunization History  Administered Date(s) Administered   Moderna Sars-Covid-2 Vaccination 12/03/2019, 12/31/2019   PFIZER Comirnaty(Gray Top)Covid-19 Tri-Sucrose Vaccine 02/01/2021   PFIZER(Purple Top)SARS-COV-2 Vaccination 02/19/2021   Td 04/09/2006   Tdap 02/03/2017      Health Maintenance Health Maintenance  Topic Date Due   Pneumococcal Vaccine 61-81 Years old (1 of 2 - PCV) Never done   Zoster Vaccines- Shingrix  (1 of 2) Never done   Colonoscopy  Never done   MAMMOGRAM  05/05/2024   INFLUENZA VACCINE  06/03/2024   Cervical Cancer Screening (HPV/Pap Cotest)  02/26/2026   DTaP/Tdap/Td (3 - Td or Tdap) 02/04/2027   Hepatitis C Screening  Completed   HIV Screening  Completed   HPV VACCINES  Aged Out   Meningococcal B Vaccine  Aged Out   COVID-19 Vaccine  Discontinued      Lifestyle: Diet:  Exercise:  Tobacco Use:   Social History   Tobacco Use  Smoking Status Never  Smokeless Tobacco Never     Alcohol use:   Social History   Substance and Sexual Activity  Alcohol Use Yes   Alcohol/week: 7.0 standard drinks of alcohol   Types: 7 Glasses of wine per week      Current Vital signs:  There were no vitals filed for this visit.   Previous vital signs:     01/05/2024    3:06 PM  Vitals with BMI  Systolic 160  Diastolic 92  Pulse 101      Past Medical History:  Diagnosis Date   CHEST PAIN, ATYPICAL 08/08/2010   Qualifier: Diagnosis of  By: Armin Bers MD, Jacquelyn     Essential hypertension 12/31/2006   Qualifier: Diagnosis of  By: Alen Amy     History of anemia 03/12/2018   Hypertension      Current Outpatient Medications on File Prior to Visit  Medication Sig Dispense Refill   amoxicillin -clavulanate (AUGMENTIN )  875-125 MG tablet Take 1 tablet by mouth 2 (two) times daily. 20 tablet 0   benzonatate  (TESSALON ) 200 MG capsule Take 1 capsule (200 mg total) by mouth 2 (two) times daily as needed for cough. 20 capsule 0   candesartan  (ATACAND ) 32 MG tablet TAKE 1 TABLET BY MOUTH NIGHTLY AT BEDTIME 90 tablet 3   diclofenac  Sodium (VOLTAREN ) 1 % GEL Apply 4 g topically 4 (four) times daily. 350 g 0   empagliflozin  (JARDIANCE ) 10 MG TABS tablet Take 1 tablet (10 mg total) by mouth daily. 90 tablet 3   ferrous sulfate  325 (65 FE) MG tablet Take 1 tablet (325 mg total) by mouth daily with breakfast. 90 tablet 3   fluticasone  (FLONASE ) 50 MCG/ACT nasal spray Place 1 spray into both nostrils daily. 1 spray in each nostril every day (Patient not taking: Reported on 01/05/2024) 16 g 5   furosemide  (LASIX ) 20 MG tablet TAKE 1 TABLET BY MOUTH DAILY AS NEEDED FOR fluid OR EDEMA 90 tablet 3   hydrALAZINE  (APRESOLINE ) 50 MG tablet TAKE 1 TABLET BY MOUTH 2 TIMES DAILY 60 tablet 3   indapamide  (LOZOL ) 2.5 MG tablet TAKE 2 TABLETS BY MOUTH EVERY DAY 180 tablet 1   Multiple Vitamin (MULTIVITAMIN WITH MINERALS) TABS tablet Take 1 tablet by mouth daily.     omeprazole  (PRILOSEC) 20 MG  capsule TAKE 1 CAPSULE BY MOUTH EVERY DAY AS NEEDED 90 capsule 5   spironolactone  (ALDACTONE ) 50 MG tablet TAKE 1 TABLET BY MOUTH NIGHTLY AT BEDTIME 90 tablet 3   No current facility-administered medications on file prior to visit.     ROS   Physical Exam   Assessment/Plan/Recommendation:  Health Maintenance Due  Topic Date Due   Pneumococcal Vaccine 52-54 Years old (1 of 2 - PCV) Never done   Zoster Vaccines- Shingrix  (1 of 2) Never done   Colonoscopy  Never done      9/

## 2024-04-12 ENCOUNTER — Encounter: Payer: Self-pay | Admitting: *Deleted

## 2024-05-14 ENCOUNTER — Other Ambulatory Visit: Payer: Self-pay | Admitting: Cardiology

## 2024-05-17 ENCOUNTER — Encounter: Payer: Self-pay | Admitting: Nurse Practitioner

## 2024-05-17 ENCOUNTER — Ambulatory Visit: Payer: PRIVATE HEALTH INSURANCE | Admitting: Nurse Practitioner

## 2024-05-17 VITALS — BP 160/90 | HR 84 | Temp 97.4°F

## 2024-05-17 DIAGNOSIS — I1A0 Resistant hypertension: Secondary | ICD-10-CM

## 2024-05-17 DIAGNOSIS — Z789 Other specified health status: Secondary | ICD-10-CM

## 2024-05-17 NOTE — Progress Notes (Signed)
 [facility]ational [facility]- [facility]  Subjective:  Patient ID: Pamela Schneider, female    DOB: 1971/10/17  Age: 53 y.o. MRN: [id number]  CC: wellness exam   HPI Pamela Schneider presents for wellness exam visit for insurance benefit.  Patient has a PCP: Dr. Alba  PMH significant for: HTN, CKD stage 3, Anemia Last labs per PCP were completed: Sept 2024  Health Maintenance:  Colonoscopy: due for this. She is willing to complete but admits she hasn't made the time for this.  Mammogram: 05/08/2023, scheduled for [facility] mammogram event 05/25/24 Pap: 2022, normal    Smoker: never   Immunizations:  Shingrix -  x1, declined second dose.  Flu- declines.  Tdap- due April 2028   Lifestyle: Diet- monitors salt.  Exercise- does not exercise    Of note, BP is uncontrolled today. She admits she is not taking some antihypertensives including candesartan , spirolactone, and she takes hydralazine  only once a day. Denies side effects to medication. However, she feels that she is taking too many meds at this time so she stopped some. Has been off of these for several months.    Past Medical History:  Diagnosis Date   CHEST PAIN, ATYPICAL 08/08/2010   Qualifier: Diagnosis of  By: Rogerio MD, Jacquelyn     Essential hypertension 12/31/2006   Qualifier: Diagnosis of  By: Geralene Service     History of anemia 03/12/2018   Hypertension     Past Surgical History:  Procedure Laterality Date   TUBAL LIGATION      Outpatient Medications Prior to Visit  Medication Sig Dispense Refill   diclofenac  Sodium (VOLTAREN ) 1 % GEL Apply 4 g topically 4 (four) times daily. 350 g 0   empagliflozin  (JARDIANCE ) 10 MG TABS tablet Take 1 tablet (10 mg total) by mouth daily. 90 tablet 3   ferrous sulfate  325 (65 FE) MG tablet Take 1 tablet (325 mg total) by mouth daily with breakfast. 90 tablet 3   hydrALAZINE  (APRESOLINE ) 50 MG tablet TAKE 1 TABLET BY MOUTH 2 TIMES DAILY (Patient taking differently: Take  50 mg by mouth daily.) 60 tablet 3   indapamide  (LOZOL ) 2.5 MG tablet TAKE 2 TABLETS BY MOUTH EVERY DAY 180 tablet 3   Multiple Vitamin (MULTIVITAMIN WITH MINERALS) TABS tablet Take 1 tablet by mouth daily.     omeprazole  (PRILOSEC) 20 MG capsule TAKE 1 CAPSULE BY MOUTH EVERY DAY AS NEEDED 90 capsule 5   amoxicillin -clavulanate (AUGMENTIN ) 875-125 MG tablet Take 1 tablet by mouth 2 (two) times daily. 20 tablet 0   benzonatate  (TESSALON ) 200 MG capsule Take 1 capsule (200 mg total) by mouth 2 (two) times daily as needed for cough. 20 capsule 0   candesartan  (ATACAND ) 32 MG tablet TAKE 1 TABLET BY MOUTH NIGHTLY AT BEDTIME (Patient not taking: Reported on 05/17/2024) 90 tablet 3   fluticasone  (FLONASE ) 50 MCG/ACT nasal spray Place 1 spray into both nostrils daily. 1 spray in each nostril every day (Patient not taking: Reported on 01/05/2024) 16 g 5   furosemide  (LASIX ) 20 MG tablet TAKE 1 TABLET BY MOUTH DAILY AS NEEDED FOR fluid OR EDEMA (Patient not taking: Reported on 05/17/2024) 90 tablet 3   spironolactone  (ALDACTONE ) 50 MG tablet TAKE 1 TABLET BY MOUTH NIGHTLY AT BEDTIME (Patient not taking: Reported on 05/17/2024) 90 tablet 3   No facility-administered medications prior to visit.    ROS Review of Systems  Constitutional:  Negative for activity change and fatigue.  HENT:  Negative for hearing  loss.   Eyes:  Negative for visual disturbance.  Respiratory:  Negative for shortness of breath.   Cardiovascular:  Negative for chest pain and leg swelling.  Gastrointestinal:  Negative for constipation, diarrhea, nausea and vomiting.  Musculoskeletal:  Positive for arthralgias (bilateral knees).  Neurological:  Negative for dizziness and headaches.    Objective:  BP (!) 160/90 (BP Location: Left Arm, Patient Position: Sitting, Cuff Size: Normal)   Pulse 84   Temp (!) 97.4 F (36.3 C) (Temporal) SpO2 100%   Physical Exam Constitutional:      General: She is not in acute distress. HENT:      Head: Normocephalic.     Right Ear: Tympanic membrane, ear canal and external ear normal.     Left Ear: Tympanic membrane, ear canal and external ear normal.     Mouth/Throat:     Mouth: Mucous membranes are moist.  Eyes:     Pupils: Pupils are equal, round, and reactive to light.  Cardiovascular:     Rate and Rhythm: Normal rate and regular rhythm.     Heart sounds: Normal heart sounds.  Pulmonary:     Effort: Pulmonary effort is normal.     Breath sounds: Normal breath sounds.  Abdominal:     Palpations: Abdomen is soft.     Tenderness: There is no abdominal tenderness.  Musculoskeletal:        General: Normal range of motion.     Right lower leg: No edema.     Left lower leg: No edema.  Skin:    General: Skin is warm.  Neurological:     General: No focal deficit present.     Mental Status: She is alert and oriented to person, place, and time.  Psychiatric:        Mood and Affect: Mood normal.        Behavior: Behavior normal.      Assessment & Plan:    Pamela Schneider was seen today for wellness exam.  Diagnoses and all orders for this visit:  Participant in health and wellness plan Adult wellness physical was conducted today. Importance of diet and exercise were discussed in detail.  We reviewed immunizations and gave recommendations regarding current immunization needed for age. Patient still declines second dose of shingrix  at this time.  Preventative health exams needed: Colonoscopy and mammogram. Colonoscopy order in place per PCP. She is still willing to complete this.  I provided her with phone number with [facility] to schedule.  Mammogram is ordered and scheduled for 05/25/24.    Patient was advised yearly wellness exam and follow-up with PCP for further management of HTN and f/u labs.   Resistant hypertension BP is uncontrolled today, likely secondary to medication non-adherence. Discussed importance of BP control in setting of chronic kidney disease and potential  complications if BP remains uncontrolled. Encouraged taking medications as prescribed. Recommended patient follow-up with PCP soon for further management and to discuss concerns with current medication regimen. Of note, PCP ordered Renal US - instructed patient to complete.     No orders of the defined types were placed in this encounter.   No orders of the defined types were placed in this encounter.   Follow-up: as needed.

## 2024-05-25 ENCOUNTER — Ambulatory Visit
Admission: RE | Admit: 2024-05-25 | Discharge: 2024-05-25 | Disposition: A | Discharge status: Home / Self Care | Payer: PRIVATE HEALTH INSURANCE | Source: Ambulatory Visit | Attending: Nurse Practitioner | Admitting: Nurse Practitioner

## 2024-05-25 DIAGNOSIS — Z1231 Encounter for screening mammogram for malignant neoplasm of breast: Secondary | ICD-10-CM

## 2024-06-28 NOTE — Progress Notes (Signed)
 Patient did not show up fror appoint. Precharting started to open visit in preparation

## 2024-07-29 ENCOUNTER — Ambulatory Visit: Payer: PRIVATE HEALTH INSURANCE | Admitting: Family Medicine

## 2024-07-29 ENCOUNTER — Encounter: Payer: Self-pay | Admitting: Family Medicine

## 2024-07-29 VITALS — BP 144/92 | HR 76 | Ht 61.0 in | Wt 179.1 lb

## 2024-07-29 DIAGNOSIS — Z0000 Encounter for general adult medical examination without abnormal findings: Secondary | ICD-10-CM

## 2024-07-29 DIAGNOSIS — Z1211 Encounter for screening for malignant neoplasm of colon: Secondary | ICD-10-CM

## 2024-07-29 DIAGNOSIS — I1A0 Resistant hypertension: Secondary | ICD-10-CM

## 2024-07-29 DIAGNOSIS — D500 Iron deficiency anemia secondary to blood loss (chronic): Secondary | ICD-10-CM

## 2024-07-29 DIAGNOSIS — N1831 Chronic kidney disease, stage 3a: Secondary | ICD-10-CM

## 2024-07-29 NOTE — Assessment & Plan Note (Signed)
 Last hemoglobin 10.9, on iron.  Repeat CBC today.

## 2024-07-29 NOTE — Addendum Note (Signed)
 Addended by: ALBA SHARPER on: 07/29/2024 09:21 AM   Modules accepted: Orders

## 2024-07-29 NOTE — Assessment & Plan Note (Signed)
 Mildly elevated today, on multimodal regimen. - Continue candesartan  32 mg daily, indapamide  2.5 mg twice daily, hydralazine  50 mg twice a day - Instructed to restart spironolactone  50 mg daily - Follow-up 3 weeks for repeat BMP and reassessment of blood pressure - Lipid panel for cardiac risk assessment

## 2024-07-29 NOTE — Assessment & Plan Note (Signed)
 On Jardiance , candesartan .  Continue as above. - BMP today

## 2024-07-29 NOTE — Patient Instructions (Signed)
 It was great to see you! Thank you for allowing me to participate in your care!  Our plans for today:  - Please restart taking the Spironolactone , this can help your blood pressure and your kidneys. - I will let you know the results of your labs today. - I have place a referral to the stomach doctors to schedule your colonoscopy. They should call you in the next couple weeks. - I will see you again in 3 weeks.   Please arrive 15 minutes PRIOR to your next scheduled appointment time! If you do not, this affects OTHER patients' care.  Take care and seek immediate care sooner if you develop any concerns.   Pamela Provencal, MD, PGY-3 [facility] 8:43 AM 07/29/2024  [facility]

## 2024-07-29 NOTE — Progress Notes (Addendum)
    SUBJECTIVE:   CHIEF COMPLAINT / HPI: physical  No concerns. Here for physical. Medications Taking Candesartan  Using Voltaren  Gel for knees Jardiance  taking Taking Iron tablets Hydralazine  only taking once a day Taking indapamide  Taking omeprazole  Not taking Spironolactone  Takes Lasix  as needed for leg swelling  BP Follows with cardiology Meds as above Not checking at home Has cuff at home  HM Declines Pneumonia vaccine Never had a colonoscopy Pap due 2027 Normal Mammogram in July  Other Does not smoke, never smoke No other drugs Drinks 1 glass of wine every 2-3 days  PERTINENT  PMH / PSH: HTN, LVH, GERD, CDK3a  OBJECTIVE:   BP (!) 144/92   Pulse 76   Ht 5' 1 (1.549 m)   Wt 179 lb 2 oz (81.3 kg)   LMP 02/01/2021 Comment: Patient states she hasnt had a period since her last physical  SpO2 100%   BMI 33.85 kg/m   General: A&O, NAD HEENT: No sign of trauma, PERRL, EOM grossly intact, moist mucous membranes Cardiac: RRR, no m/r/g Respiratory: CTAB, normal WOB, no w/c/r GI: Soft, NTTP, non-distended, no rebound or guarding Extremities: NTTP, no peripheral edema. Neuro: Moves all four extremities appropriately. Psych: Appropriate mood and affect   ASSESSMENT/PLAN:   Assessment & Plan Annual physical exam Up-to-date on Pap smear.  Next 2027. Up-to-date on flu and COVID vaccines. Declined pneumonia vaccine. Colonoscopy referral as below. Repeat mammogram next July. Resistant hypertension Mildly elevated today, on multimodal regimen. - Continue candesartan  32 mg daily, indapamide  2.5 mg twice daily, hydralazine  50 mg twice a day - Instructed to restart spironolactone  50 mg daily - Follow-up 3 weeks for repeat BMP and reassessment of blood pressure - Lipid panel for cardiac risk assessment  Stage 3a chronic kidney disease (HCC) On Jardiance , candesartan .  Continue as above. - BMP today Iron deficiency anemia due to chronic blood loss Last  hemoglobin 10.9, on iron.  Repeat CBC today. Colon cancer screening Ambulatory referral to gastroenterology for colonoscopy.  Return in about 3 weeks (around 08/19/2024) for BP, neck.  Excepted with Dr. McDiarmid  Mehgan Santmyer, MD [facility]

## 2024-07-30 LAB — CBC WITH DIFFERENTIAL/PLATELET
Basophils Absolute: 0 x10E3/uL (ref 0.0–0.2)
Basos: 1 %
EOS (ABSOLUTE): 0.2 x10E3/uL (ref 0.0–0.4)
Eos: 4 %
Hematocrit: 39.9 % (ref 34.0–46.6)
Hemoglobin: 11.8 g/dL (ref 11.1–15.9)
Immature Grans (Abs): 0 x10E3/uL (ref 0.0–0.1)
Immature Granulocytes: 0 %
Lymphocytes Absolute: 1.1 x10E3/uL (ref 0.7–3.1)
Lymphs: 27 %
MCH: 21.6 pg — ABNORMAL LOW (ref 26.6–33.0)
MCHC: 29.6 g/dL — ABNORMAL LOW (ref 31.5–35.7)
MCV: 73 fL — ABNORMAL LOW (ref 79–97)
Monocytes Absolute: 0.2 x10E3/uL (ref 0.1–0.9)
Monocytes: 5 %
Neutrophils Absolute: 2.6 x10E3/uL (ref 1.4–7.0)
Neutrophils: 63 %
Platelets: 265 x10E3/uL (ref 150–450)
RBC: 5.47 x10E6/uL — ABNORMAL HIGH (ref 3.77–5.28)
RDW: 15.7 % — ABNORMAL HIGH (ref 11.7–15.4)
WBC: 4.1 x10E3/uL (ref 3.4–10.8)

## 2024-07-30 LAB — LIPID PANEL
Chol/HDL Ratio: 1.9 ratio (ref 0.0–4.4)
Cholesterol, Total: 164 mg/dL (ref 100–199)
HDL: 85 mg/dL (ref >39)
LDL Chol Calc (NIH): 62 mg/dL (ref 0–99)
Triglycerides: 97 mg/dL (ref 0–149)
VLDL Cholesterol Cal: 17 mg/dL (ref 5–40)

## 2024-07-30 LAB — BASIC METABOLIC PANEL WITH GFR
BUN/Creatinine Ratio: 25 — ABNORMAL HIGH (ref 9–23)
BUN: 30 mg/dL — ABNORMAL HIGH (ref 6–24)
CO2: 25 mmol/L (ref 20–29)
Calcium: 10.1 mg/dL (ref 8.7–10.2)
Chloride: 101 mmol/L (ref 96–106)
Creatinine, Ser: 1.21 mg/dL — ABNORMAL HIGH (ref 0.57–1.00)
Glucose: 97 mg/dL (ref 70–99)
Potassium: 4 mmol/L (ref 3.5–5.2)
Sodium: 142 mmol/L (ref 134–144)
eGFR: 53 mL/min/1.73 — ABNORMAL LOW (ref >59)

## 2024-08-04 ENCOUNTER — Ambulatory Visit: Payer: Self-pay | Admitting: Family Medicine

## 2024-08-22 ENCOUNTER — Encounter: Payer: Self-pay | Admitting: Family Medicine

## 2024-08-22 ENCOUNTER — Other Ambulatory Visit: Payer: Self-pay

## 2024-08-22 ENCOUNTER — Ambulatory Visit: Payer: PRIVATE HEALTH INSURANCE | Admitting: Family Medicine

## 2024-08-22 VITALS — BP 138/82 | HR 84 | Ht 61.0 in | Wt 177.1 lb

## 2024-08-22 DIAGNOSIS — N1831 Chronic kidney disease, stage 3a: Secondary | ICD-10-CM

## 2024-08-22 DIAGNOSIS — I1A0 Resistant hypertension: Secondary | ICD-10-CM

## 2024-08-22 MED ORDER — EMPAGLIFLOZIN 10 MG PO TABS
10.0000 mg | ORAL_TABLET | Freq: Every day | ORAL | 3 refills | Status: AC
Start: 2024-08-22 — End: ?

## 2024-08-22 NOTE — Patient Instructions (Addendum)
 It was great to see you! Thank you for allowing me to participate in your care!  Our plans for today:   VISIT SUMMARY: Today, we discussed your blood pressure management and medication adherence. We also addressed concerns about your kidney function and planned your colorectal cancer screening.  YOUR PLAN: RESISTANT HYPERTENSION: You have high blood pressure that is difficult to control, partly due to inconsistent medication use. -Take your medications consistently: candesartan , hydralazine , indapamide , and spironolactone . -Consider taking all medications in the morning to help with adherence. -We will recheck your kidney function today, I will let you know the results. -Follow up in two months to reassess your blood pressure and kidney function.   COLORECTAL CANCER SCREENING: You need to schedule your colorectal cancer screening. -We have authorized a referral for your colonoscopy. -Please call the provided contact number to schedule your colonoscopy. [phone]       Please arrive 15 minutes PRIOR to your next scheduled appointment time! If you do not, this affects OTHER patients' care.  Take care and seek immediate care sooner if you develop any concerns.   Ozell Provencal, MD, PGY-3 [facility] 3:34 PM 08/22/2024  [facility]

## 2024-08-22 NOTE — Assessment & Plan Note (Addendum)
 Controlled today, despite inconsistent adherence to medication regimen. Extensively counseled on importance of blood pressure management. - Continue candesartan  32 mg - Continue hydralazine  50 mg twice daily - Continue indapamide  5 mg daily - Continue spironolactone  50 mg daily - Encouraged consistent medication adherence - BMP today - Follow-up in two months to reassess blood pressure and kidney function.

## 2024-08-22 NOTE — Progress Notes (Signed)
    SUBJECTIVE:   CHIEF COMPLAINT / HPI: f/u BP  Discussed the use of AI scribe software for clinical note transcription with the patient, who gave verbal consent to proceed.  History of Present Illness Pamela Schneider is a 53 year old female with hypertension who presents for follow-up on blood pressure management.  Hypertension management - Inconsistent adherence to antihypertensive medication regimen, regularly taking morning medications but often skipping evening doses - Current antihypertensive medications: candesartan  and spironolactone  at night, hydralazine  twice daily, indapamide  5 mg in the morning - Discontinued amlodipine  due to lower extremity swelling - No home blood pressure monitoring, but believes blood pressure is well-controlled - Concerned that multiple medications may affect renal function - Implementing lifestyle modifications including increased exercise and reduced sodium intake    PERTINENT  PMH / PSH: HTN, LVH, GERD, CDK3a   OBJECTIVE:   BP 138/82   Pulse 84   Ht 5' 1 (1.549 m)   Wt 177 lb 2 oz (80.3 kg)   LMP 02/01/2021 Comment: Patient states she hasnt had a period since her last physical  SpO2 100%   BMI 33.47 kg/m   Physical Exam General: NAD, well appearing Neuro: A&O Respiratory: normal WOB on RA Extremities: Moving all 4 extremities equally   ASSESSMENT/PLAN:   Assessment & Plan Resistant hypertension Controlled today, despite inconsistent adherence to medication regimen. Extensively counseled on importance of blood pressure management. - Continue candesartan  32 mg - Continue hydralazine  50 mg twice daily - Continue indapamide  5 mg daily - Continue spironolactone  50 mg daily - Encouraged consistent medication adherence - BMP today - Follow-up in two months to reassess blood pressure and kidney function.   Return in about 2 months (around 10/22/2024) for BP f/u.  Ozell Provencal, MD, PGY-3 [facility] 3:34  PM 08/22/2024  [facility]

## 2024-08-23 LAB — BASIC METABOLIC PANEL WITH GFR
BUN/Creatinine Ratio: 19 (ref 9–23)
BUN: 26 mg/dL — ABNORMAL HIGH (ref 6–24)
CO2: 26 mmol/L (ref 20–29)
Calcium: 10.1 mg/dL (ref 8.7–10.2)
Chloride: 102 mmol/L (ref 96–106)
Creatinine, Ser: 1.35 mg/dL — ABNORMAL HIGH (ref 0.57–1.00)
Glucose: 76 mg/dL (ref 70–99)
Potassium: 3.8 mmol/L (ref 3.5–5.2)
Sodium: 142 mmol/L (ref 134–144)
eGFR: 47 mL/min/1.73 — ABNORMAL LOW (ref >59)

## 2024-08-24 ENCOUNTER — Ambulatory Visit: Payer: Self-pay | Admitting: Family Medicine

## 2024-08-30 ENCOUNTER — Other Ambulatory Visit: Payer: Self-pay | Admitting: Cardiology

## 2024-09-16 ENCOUNTER — Other Ambulatory Visit: Payer: Self-pay

## 2024-09-16 DIAGNOSIS — D509 Iron deficiency anemia, unspecified: Secondary | ICD-10-CM

## 2024-09-16 MED ORDER — FERROUS SULFATE 325 (65 FE) MG PO TABS
325.0000 mg | ORAL_TABLET | Freq: Every day | ORAL | 3 refills | Status: AC
Start: 2024-09-16 — End: ?

## 2024-10-01 ENCOUNTER — Other Ambulatory Visit: Payer: Self-pay | Admitting: Cardiology

## 2024-10-01 DIAGNOSIS — K2100 Gastro-esophageal reflux disease with esophagitis, without bleeding: Secondary | ICD-10-CM
